# Patient Record
Sex: Male | Born: 1965 | Race: White | Hispanic: No | Marital: Married | Smoking: Never smoker
Health system: Southern US, Community
[De-identification: ages and names within clinical notes are randomized; demographics above are authoritative.]

## PROBLEM LIST (undated history)

## (undated) DIAGNOSIS — Z862 Personal history of diseases of the blood and blood-forming organs and certain disorders involving the immune mechanism: Secondary | ICD-10-CM

## (undated) DIAGNOSIS — K219 Gastro-esophageal reflux disease without esophagitis: Secondary | ICD-10-CM

## (undated) DIAGNOSIS — R1012 Left upper quadrant pain: Secondary | ICD-10-CM

## (undated) DIAGNOSIS — E7800 Pure hypercholesterolemia, unspecified: Secondary | ICD-10-CM

## (undated) DIAGNOSIS — F419 Anxiety disorder, unspecified: Secondary | ICD-10-CM

## (undated) DIAGNOSIS — R7303 Prediabetes: Secondary | ICD-10-CM

## (undated) DIAGNOSIS — D179 Benign lipomatous neoplasm, unspecified: Secondary | ICD-10-CM

## (undated) HISTORY — DX: Benign lipomatous neoplasm, unspecified: D17.9

## (undated) HISTORY — PX: KNEE SURGERY: SHX244

## (undated) HISTORY — PX: COLONOSCOPY: SHX174

## (undated) HISTORY — PX: TONSILLECTOMY: SUR1361

## (undated) HISTORY — PX: SHOULDER SURGERY: SHX246

## (undated) HISTORY — PX: NASAL ENDOSCOPY: SHX286

---

## 2004-01-15 ENCOUNTER — Other Ambulatory Visit: Payer: Self-pay

## 2006-02-04 ENCOUNTER — Ambulatory Visit: Payer: Self-pay | Admitting: Vascular Surgery

## 2006-07-13 ENCOUNTER — Ambulatory Visit: Payer: Self-pay | Admitting: Internal Medicine

## 2008-04-04 ENCOUNTER — Ambulatory Visit: Payer: Self-pay | Admitting: Otolaryngology

## 2008-05-01 ENCOUNTER — Ambulatory Visit: Payer: Self-pay | Admitting: Sports Medicine

## 2008-05-03 ENCOUNTER — Ambulatory Visit: Payer: Self-pay | Admitting: Unknown Physician Specialty

## 2009-02-20 ENCOUNTER — Ambulatory Visit: Payer: Self-pay | Admitting: Unknown Physician Specialty

## 2009-04-19 ENCOUNTER — Ambulatory Visit: Payer: Self-pay | Admitting: Ophthalmology

## 2010-09-06 ENCOUNTER — Ambulatory Visit: Payer: Self-pay

## 2010-09-11 ENCOUNTER — Ambulatory Visit: Payer: Self-pay | Admitting: Sports Medicine

## 2013-02-04 ENCOUNTER — Ambulatory Visit: Payer: Self-pay | Admitting: Physician Assistant

## 2013-04-27 DIAGNOSIS — Z85828 Personal history of other malignant neoplasm of skin: Secondary | ICD-10-CM | POA: Insufficient documentation

## 2013-08-29 ENCOUNTER — Ambulatory Visit: Payer: Self-pay | Admitting: Physician Assistant

## 2014-06-16 DIAGNOSIS — E7800 Pure hypercholesterolemia, unspecified: Secondary | ICD-10-CM | POA: Insufficient documentation

## 2015-08-02 IMAGING — CT CT ANGIO CHEST
1 of 2 series · 18 of 30 positions shown · IV contrast (APPLIED)
Comparison: None.

CLINICAL DATA: SOB and left anterior CP this am, no surg, hx
squamous cell March 2013 lower lip,

EXAM:
CT ANGIOGRAPHY CHEST WITH CONTRAST
TECHNIQUE: Multidetector CT imaging of the chest was performed using the
standard protocol during bolus administration of intravenous
contrast. Multiplanar CT image reconstructions and MIPs were
obtained to evaluate the vascular anatomy.
CONTRAST:  100 mL Isovue 370 IV

[Series 5: pe 1.0 thins · axial · 0.68mm/px · z∈[-1236,-935]mm · 18 of 339 slices shown]
[im 19/339  lung]
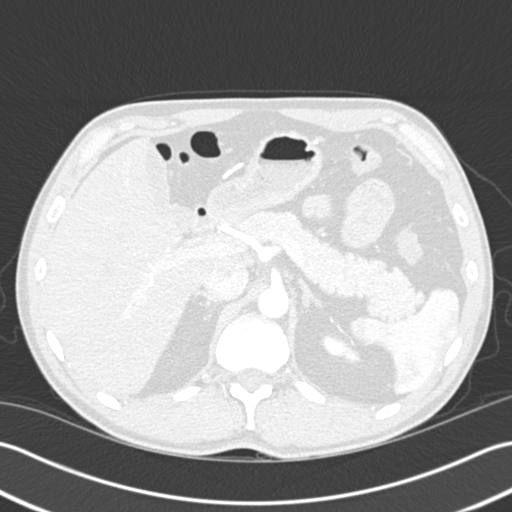
[im 38/339  mediastinal]
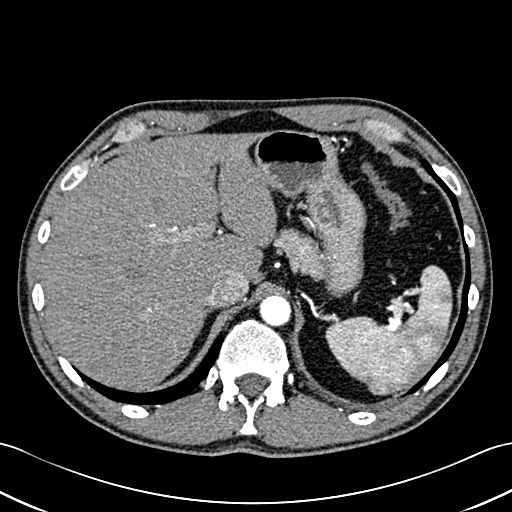
[im 57/339  lung]
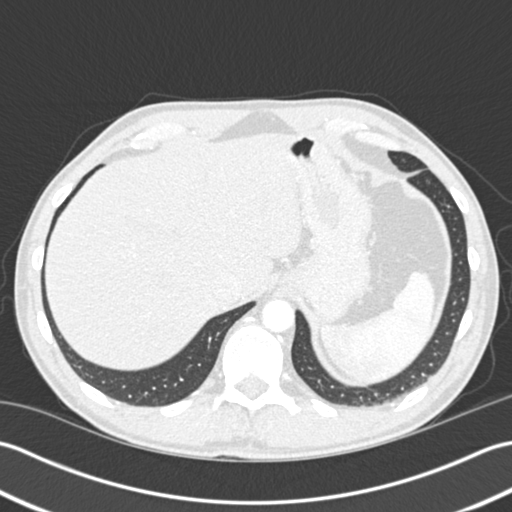
[im 76/339  mediastinal]
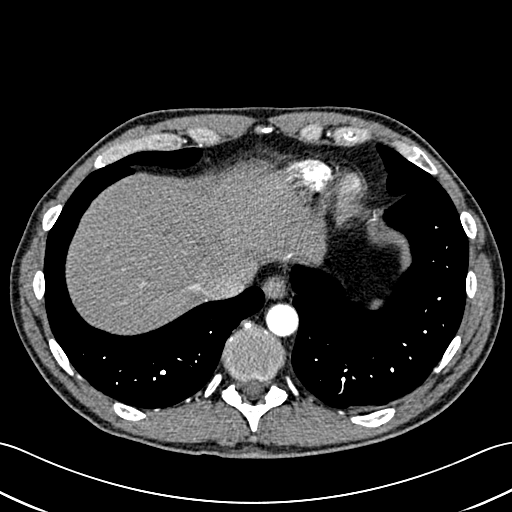
[im 94/339  lung]
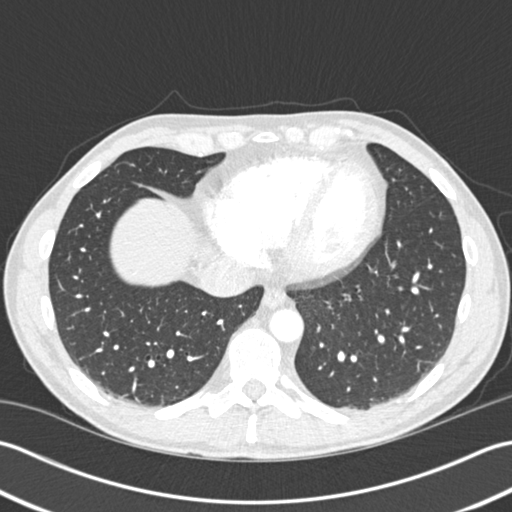
[im 113/339  mediastinal]
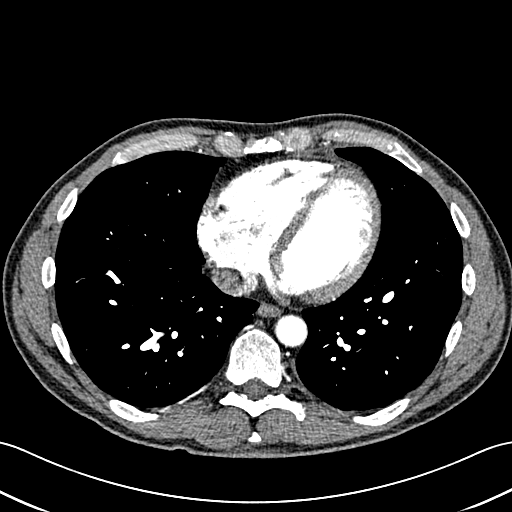
[im 132/339  lung]
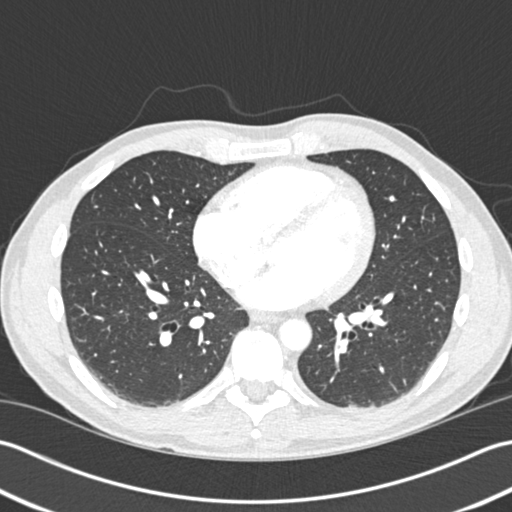
[im 151/339  mediastinal]
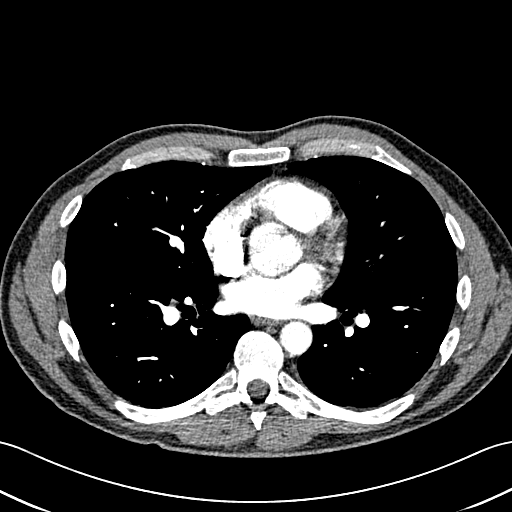
[im 160/339  lung]
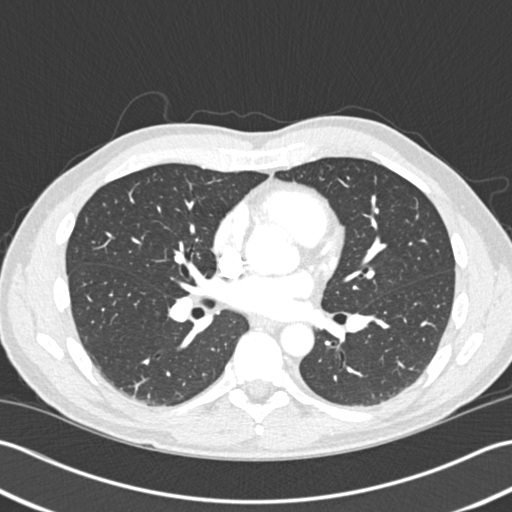
[im 170/339  mediastinal]
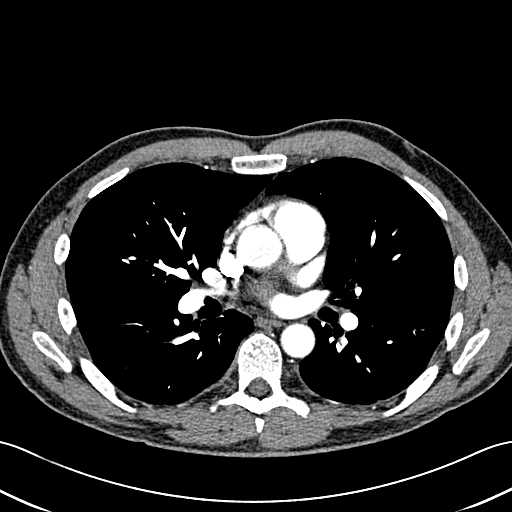
[im 188/339  lung]
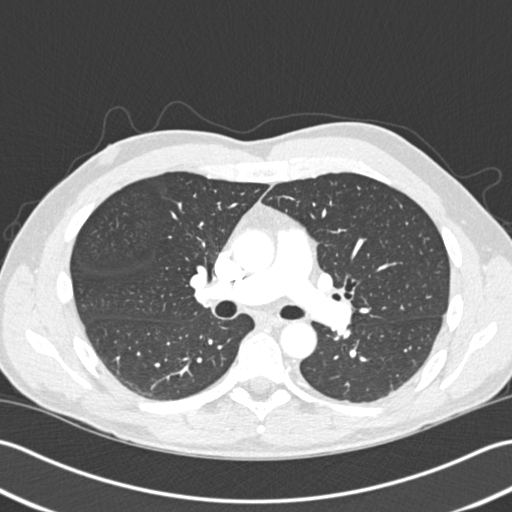
[im 207/339  mediastinal]
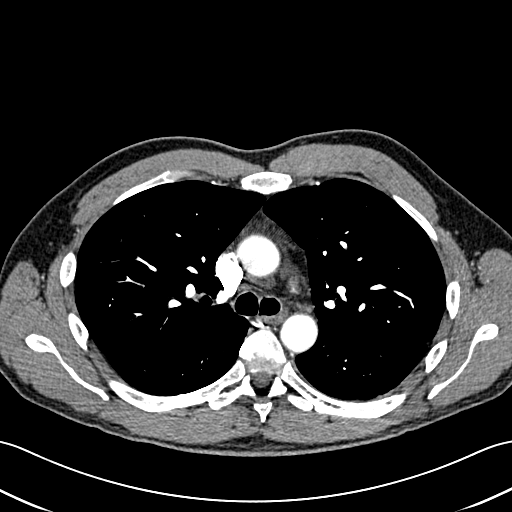
[im 226/339  lung]
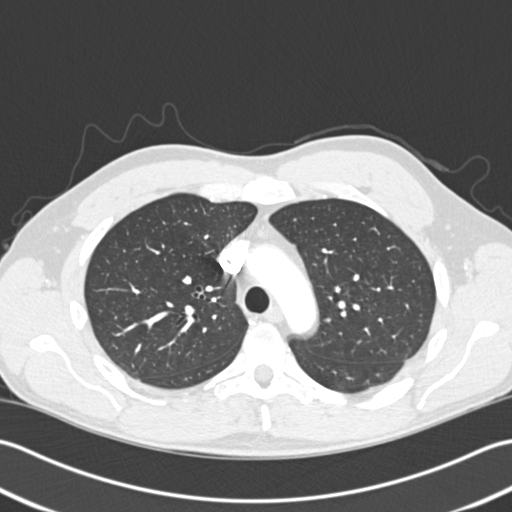
[im 245/339  mediastinal]
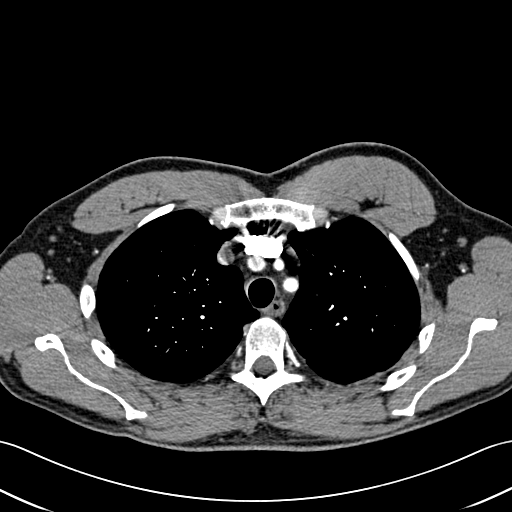
[im 263/339  lung]
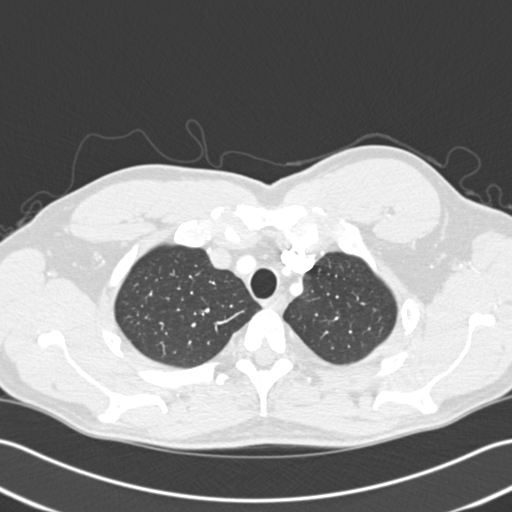
[im 282/339  mediastinal]
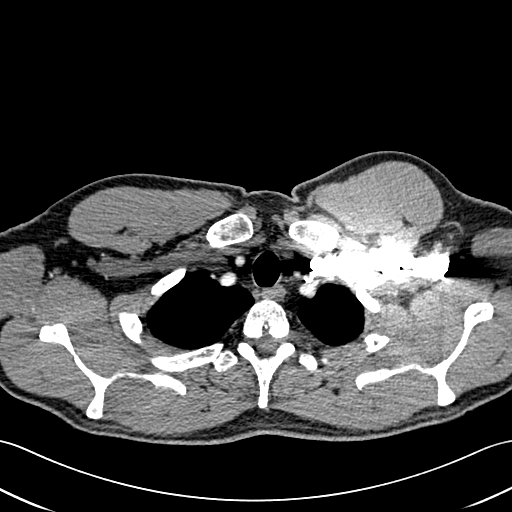
[im 301/339  lung]
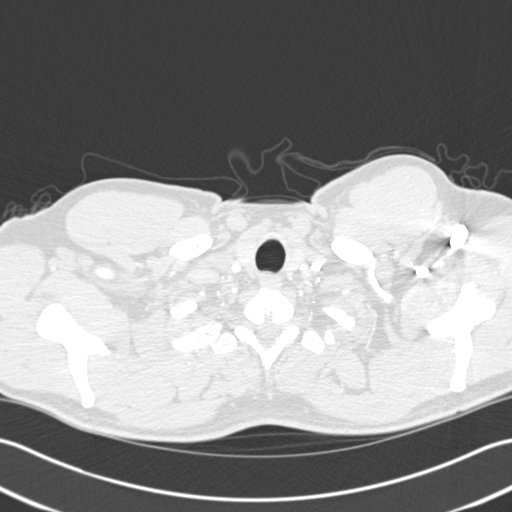
[im 320/339  mediastinal]
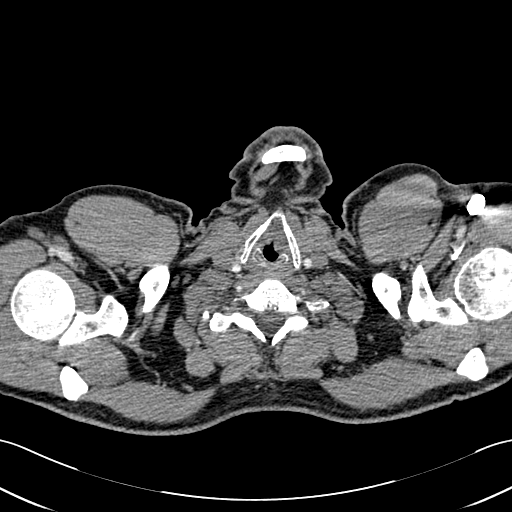

[18 of 30 positions shown; findings below may reference images not displayed]

FINDINGS: Satisfactory opacification of pulmonary arteries noted, and there is
no evidence of pulmonary emboli. Adequate contrast opacification of
the thoracic aorta with no evidence of dissection, aneurysm, or
stenosis. There is classic 3 vessel brachiocephalic arch anatomy
without proximal stenosis. No pleural or pericardial effusion. No
hilar or mediastinal adenopathy. Subpleural scarring or subsegmental
atelectasis posteriorly in both lower lobes. Lungs otherwise clear.
Minimal spurring in the lower thoracic spine. Visualized portions of
upper abdomen unremarkable.

Review of the MIP images confirms the above findings.
IMPRESSION: 1. Negative for acute PE or thoracic aortic dissection.

## 2016-07-14 DIAGNOSIS — G4700 Insomnia, unspecified: Secondary | ICD-10-CM | POA: Insufficient documentation

## 2016-07-14 DIAGNOSIS — Z0000 Encounter for general adult medical examination without abnormal findings: Secondary | ICD-10-CM | POA: Insufficient documentation

## 2018-04-26 DIAGNOSIS — M7700 Medial epicondylitis, unspecified elbow: Secondary | ICD-10-CM | POA: Insufficient documentation

## 2018-12-23 DIAGNOSIS — S46819A Strain of other muscles, fascia and tendons at shoulder and upper arm level, unspecified arm, initial encounter: Secondary | ICD-10-CM | POA: Insufficient documentation

## 2019-01-05 DIAGNOSIS — M7540 Impingement syndrome of unspecified shoulder: Secondary | ICD-10-CM | POA: Insufficient documentation

## 2019-01-18 ENCOUNTER — Ambulatory Visit (INDEPENDENT_AMBULATORY_CARE_PROVIDER_SITE_OTHER): Payer: Managed Care, Other (non HMO) | Admitting: Surgery

## 2019-01-18 ENCOUNTER — Encounter: Payer: Self-pay | Admitting: Surgery

## 2019-01-18 ENCOUNTER — Other Ambulatory Visit: Payer: Self-pay

## 2019-01-18 VITALS — BP 122/87 | HR 111 | Temp 97.9°F | Resp 14 | Ht 70.0 in | Wt 180.0 lb

## 2019-01-18 DIAGNOSIS — D179 Benign lipomatous neoplasm, unspecified: Secondary | ICD-10-CM | POA: Diagnosis not present

## 2019-01-18 NOTE — Progress Notes (Signed)
01/18/2019  Reason for Visit:  Multiple lipomas  History of Present Illness: Alexander Turner is a 53 y.o. male presenting for evaluation of multiple lipomas located in bilateral upper extremities, bilateral lower extremities, and anterior abdominal wall.  Patient has a history of multiple prior lipomas that have been resected through the years, in both arms and back.  He has new ones that are slowly growing in size, and some of which are more somewhat bothersome.  Denies any significant pain from any of them.  He wants to have them excised and wanted to check how to proceed with that since he has so many.    Patient denies any fevers, chills, issues with drainage or infection of any of the sites of prior excision or of current lipoma location, pain, or other concerns.  Past Medical History: --Depression/Anxiety --GERD --Iron deficiency anemia --Allergic rhinitis --Squamous cell cancer of lower lip --Hyperlipidemia  Past Surgical History: Past Surgical History:  Procedure Laterality Date  . COLONOSCOPY    . KNEE SURGERY Right   . NASAL ENDOSCOPY    . TONSILLECTOMY      Home Medications: Prior to Admission medications   Medication Sig Start Date End Date Taking? Authorizing Provider  buPROPion (WELLBUTRIN XL) 300 MG 24 hr tablet Take 300 mg by mouth daily.   Yes [provider]  mometasone (NASONEX) 50 MCG/ACT nasal spray Place 2 sprays into the nose daily.   Yes [provider]  pantoprazole (PROTONIX) 40 MG tablet pantoprazole 40 mg tablet,delayed release 07/16/18  Yes [provider]  zolpidem (AMBIEN) 10 MG tablet zolpidem 10 mg tablet 08/13/18  Yes [provider]    Allergies: Allergies  Allergen Reactions  . Doxycycline   . Trazodone And Nefazodone     Social History:  reports that he has never smoked. He has never used smokeless tobacco. He reports current alcohol use. He reports that he does not use drugs.   Family  History: History reviewed. No pertinent family history.  Review of Systems: Review of Systems  Constitutional: Negative for chills and fever.  HENT: Negative for hearing loss.   Eyes: Negative for blurred vision.  Respiratory: Negative for shortness of breath.   Cardiovascular: Negative for chest pain.  Gastrointestinal: Negative for abdominal pain, nausea and vomiting.  Genitourinary: Negative for dysuria.  Musculoskeletal: Negative for myalgias.  Skin: Negative for rash.  Neurological: Negative for dizziness.  Psychiatric/Behavioral: Negative for depression.    Physical Exam BP 122/87   Pulse (!) 111   Temp 97.9 F (36.6 C) (Temporal)   Resp 14   Ht 5\' 10"  (1.778 m)   Wt 180 lb (81.6 kg)   SpO2 99%   BMI 25.83 kg/m  CONSTITUTIONAL: No acute distress HEENT:  Normocephalic, atraumatic, extraocular motion intact. NECK: Trachea is midline, and there is no jugular venous distension.  RESPIRATORY:  Lungs are clear, and breath sounds are equal bilaterally. Normal respiratory effort without pathologic use of accessory muscles. CARDIOVASCULAR: Heart is regular without murmurs, gallops, or rubs. GI: The abdomen is soft, non-distended, non-tender.  MUSCULOSKELETAL:  Normal muscle strength and tone in all four extremities.  No peripheral edema or cyanosis. SKIN: Patient has numerous, at least 10 different small lipomas, ranging from 1 cm to 2 cm in size, located in bilateral upper extremities, bilateral lower extremities, and anterior abdominal wall.  No lipomas on his back, but he has evidence of prior excisions on his back.  He also has scars from prior excisions  on both upper extremities. NEUROLOGIC:  Motor and sensation is grossly normal.  Cranial nerves are grossly intact. PSYCH:  Alert and oriented to person, place and time. Affect is normal.  Laboratory Analysis: No results found for this or any previous visit (from the past 24 hour(s)).  Imaging: No results  found.  Assessment and Plan: This is a 53 y.o. male with multiple bilateral lipomas.  Discussed with the patient that since he has so many lipomas overall, it may be more worthwhile to do these all under one general anesthesia event in the operating room instead of multiple procedure appointments in the office.  Discussed with him that he should make a list of the lipomas the he absolutely wants to have removed, and the ones that he would like to have removed if possible.  That way on day of surgery, we will start with the priority ones, and then move on to the next tier.  Cannot guarantee that we can remove all of them as this would be a very long procedure, which is why he should tell us which are his tier 1 and tier 2.  Discussed with him that this would be an outpatient procedure, that we would give him prescriptions for pain medications, but that he should take it easy over the first week in order to allow all the incisions to heal well.  Discussed the risks of bleeding, infection, and injury to surrounding structures.  He's willing to proceed.  We will schedule him for 02/10/19.  He understands he will need to get COVID-19 tested prior to surgery.  Face-to-face time spent with the patient and care providers was 45 minutes, with more than 50% of the time spent counseling, educating, and coordinating care of the patient.     Melvyn Neth, [facility]

## 2019-01-18 NOTE — Patient Instructions (Addendum)
  Our surgery scheduler will call you within 24-48 hours to schedule your surgery. Please have your Blue sheet available when she calls.   Lipoma  A lipoma is a noncancerous (benign) tumor that is made up of fat cells. This is a very common type of soft-tissue growth. Lipomas are usually found under the skin (subcutaneous). They may occur in any tissue of the body that contains fat. Common areas for lipomas to appear include the back, shoulders, buttocks, and thighs.  Lipomas grow slowly, and they are usually painless. Most lipomas do not cause problems and do not require treatment. What are the causes? The cause of this condition is not known. What increases the risk? You are more likely to develop this condition if:  You are 62-35 years old.  You have a family history of lipomas. What are the signs or symptoms? A lipoma usually appears as a small, round bump under the skin. In most cases, the lump will:  Feel soft or rubbery.  Not cause pain or other symptoms. However, if a lipoma is located in an area where it pushes on nerves, it can become painful or cause other symptoms. How is this diagnosed? A lipoma can usually be diagnosed with a physical exam. You may also have tests to confirm the diagnosis and to rule out other conditions. Tests may include:  Imaging tests, such as a CT scan or MRI.  Removal of a tissue sample to be looked at under a microscope (biopsy). How is this treated? Treatment for this condition depends on the size of the lipoma and whether it is causing any symptoms.  For small lipomas that are not causing problems, no treatment is needed.  If a lipoma is bigger or it causes problems, surgery may be done to remove the lipoma. Lipomas can also be removed to improve appearance. Most often, the procedure is done after applying a medicine that numbs the area (local anesthetic). Follow these instructions at home:  Watch your lipoma for any changes.  Keep all  follow-up visits as told by your health care provider. This is important. Contact a health care provider if:  Your lipoma becomes larger or hard.  Your lipoma becomes painful, red, or increasingly swollen. These could be signs of infection or a more serious condition. Get help right away if:  You develop tingling or numbness in an area near the lipoma. This could indicate that the lipoma is causing nerve damage. Summary  A lipoma is a noncancerous tumor that is made up of fat cells.  Most lipomas do not cause problems and do not require treatment.  If a lipoma is bigger or it causes problems, surgery may be done to remove the lipoma. This information is not intended to replace advice given to you by your health care provider. Make sure you discuss any questions you have with your health care provider. Document Released: 04/04/2002 Document Revised: 03/31/2017 Document Reviewed: 03/31/2017 Elsevier Patient Education  [employer].

## 2019-01-19 ENCOUNTER — Telehealth: Payer: Self-pay | Admitting: Surgery

## 2019-01-19 NOTE — Telephone Encounter (Signed)
Patient has called and decided that he would like to have his surgical procedure done in the office. Multiple lipoma excisions. I have spoken with Dr Hampton Abbot and he agrees to removing 3-4 at a time in the office with multiple visits. Patient is scheduled to have this done in the office on 10/13.

## 2019-02-08 ENCOUNTER — Other Ambulatory Visit: Payer: Self-pay | Admitting: Surgery

## 2019-02-08 ENCOUNTER — Encounter: Payer: Self-pay | Admitting: Surgery

## 2019-02-08 ENCOUNTER — Ambulatory Visit (INDEPENDENT_AMBULATORY_CARE_PROVIDER_SITE_OTHER): Payer: Managed Care, Other (non HMO) | Admitting: Surgery

## 2019-02-08 ENCOUNTER — Other Ambulatory Visit: Payer: Self-pay

## 2019-02-08 VITALS — BP 126/80 | HR 76 | Temp 97.3°F | Resp 14 | Ht 70.0 in | Wt 179.2 lb

## 2019-02-08 DIAGNOSIS — D179 Benign lipomatous neoplasm, unspecified: Secondary | ICD-10-CM | POA: Diagnosis not present

## 2019-02-08 HISTORY — PX: LIPOMA EXCISION: SHX5283

## 2019-02-08 NOTE — Progress Notes (Signed)
  Procedure Date:  02/08/2019  Pre-operative Diagnosis:  Multiple lipomas of right upper extremity  Post-operative Diagnosis:  Multiple lipomas of right upper extremity  Procedure:  Excision of three lipomas of right upper extremity.  Surgeon:  Melvyn Neth, MD  Assistant:  Deno Etienne, PA-S  Anesthesia:  10 ml 1% lidocaine with epi  Estimated Blood Loss:  5 ml  Specimens:  Right arm, right proximal forearm, right distal forearm lipomas  Complications:  None  Indications for Procedure:  This is a 53 y.o. male with diagnosis of multiple lipomas in the right upper extremity.  The patient wishes to have them excised. The risks of bleeding, abscess or infection, injury to surrounding structures, and need for further procedures were all discussed with the patient and he was willing to proceed.  Description of Procedure: The patient was correctly identified at bedside.  The patient was placed supine.  Appropriate time-outs were performed.  The patient's right upper extremity was prepped and draped in usual sterile fashion.  The location of three lipomas was marked in the distal arm, proximal forearm, and distal forearm.  Local anesthetic was infused intradermally over each lipoma.  For each lipoma, a 2-3 cm incision was made over the lipoma, and scalpel was used to dissect down the subcutaneous tissue to the lipoma itself.  Skin flaps were created, and then the lipoma was excised sharply, intact.  Each one was sent off to pathology separately.  The cavities were then irrigated and hemostasis was good.  The wounds were then closed in two layers using 3-0 Vicryl and 4-0 Monocryl.  The incisions were cleaned and sealed with DermaBond.   The patient tolerated the procedure well and all sharps were appropriately disposed of at the end of the case.   --Activity restrictions with right arm were given. --Follow up in two weeks --Will proceed with excision of lipomas of left upper  extremity in two weeks.   Melvyn Neth, MD

## 2019-02-08 NOTE — Patient Instructions (Addendum)
Today we have removed a Lipoma in our office. Please see information below regarding this type of tumor.  You are free to shower in 48 hours. This will be on 02/10/19.    You have glue on your skin and sutures under the skin. The glue will come off on it's own in 10-14 days. You may shower normally until this occurs but do not submerge.  Please use Tylenol or Ibuprofen for pain as needed, may use ice to the area for comfort also.   We will see you back in 2-3 weeks to ensure that this has healed, to review the final pathology, and to remove the lipomas from your left arm. Please see your appointment below. You may continue your regular activities right away but if you are having pain while doing something, stop what you are doing and try this activity once again in 3 days. Please call our office with any questions or concerns prior to your appointment.   Lipoma Removal Lipoma removal is a surgical procedure to remove a noncancerous (benign) tumor that is made up of fat cells (lipoma). Most lipomas are small and painless and do not require treatment. They can form in many areas of the body but are most common under the skin of the back, shoulders, arms, and thighs. You may need lipoma removal if you have a lipoma that is large, growing, or causing discomfort. Lipoma removal may also be done for cosmetic reasons. Tell a health care provider about:  Any allergies you have.  All medicines you are taking, including vitamins, herbs, eye drops, creams, and over-the-counter medicines.  Any problems you or family members have had with anesthetic medicines.  Any blood disorders you have.  Any surgeries you have had.  Any medical conditions you have.  Whether you are pregnant or may be pregnant. What are the risks? Generally, this is a safe procedure. However, problems may occur, including:  Infection.  Bleeding.  Allergic reactions to medicines.  Damage to nerves or blood vessels near the  lipoma.  Scarring.  What happens before the procedure? Staying hydrated Follow instructions from your health care provider about hydration, which may include:  Up to 2 hours before the procedure - you may continue to drink clear liquids, such as water, clear fruit juice, black coffee, and plain tea.  Eating and drinking restrictions Follow instructions from your health care provider about eating and drinking, which may include:  8 hours before the procedure - stop eating heavy meals or foods such as meat, fried foods, or fatty foods.  6 hours before the procedure - stop eating light meals or foods, such as toast or cereal.  6 hours before the procedure - stop drinking milk or drinks that contain milk.  2 hours before the procedure - stop drinking clear liquids.  Medicines  Ask your health care provider about: ? Changing or stopping your regular medicines. This is especially important if you are taking diabetes medicines or blood thinners. ? Taking medicines such as aspirin and ibuprofen. These medicines can thin your blood. Do not take these medicines before your procedure if your health care provider instructs you not to.  You may be given antibiotic medicine to help prevent infection. General instructions  Ask your health care provider how your surgical site will be marked or identified.  You will have a physical exam. Your health care provider will check the size of the lipoma and whether it can be moved easily.  You may have  imaging tests, such as: ? X-rays. ? CT scan. ? MRI.  Plan to have someone take you home from the hospital or clinic. What happens during the procedure?  To reduce your risk of infection: ? Your health care team will wash or sanitize their hands. ? Your skin will be washed with soap.  You will be given one or more of the following: ? A medicine to help you relax (sedative). ? A medicine to numb the area (local anesthetic). ? A medicine to make  you fall asleep (general anesthetic). ? A medicine that is injected into an area of your body to numb everything below the injection site (regional anesthetic).  An incision will be made over the lipoma or very near the lipoma. The incision may be made in a natural skin line or crease.  Tissues, nerves, and blood vessels near the lipoma will be moved out of the way.  The lipoma and the capsule that surrounds it will be separated from the surrounding tissues.  The lipoma will be removed.  The incision may be closed with stitches (sutures).  A bandage (dressing) will be placed over the incision. What happens after the procedure?  Do not drive for 24 hours if you received a sedative.  Your blood pressure, heart rate, breathing rate, and blood oxygen level will be monitored until the medicines you were given have worn off. This information is not intended to replace advice given to you by your health care provider. Make sure you discuss any questions you have with your health care provider. Document Released: 06/28/2015 Document Revised: 09/20/2015 Document Reviewed: 06/28/2015 Elsevier Interactive Patient Education  [employer].

## 2019-02-10 ENCOUNTER — Ambulatory Visit: Admit: 2019-02-10 | Payer: Managed Care, Other (non HMO) | Admitting: Surgery

## 2019-02-10 SURGERY — EXCISION LIPOMA
Anesthesia: General

## 2019-02-22 ENCOUNTER — Telehealth: Payer: Self-pay | Admitting: Surgery

## 2019-02-22 NOTE — Telephone Encounter (Signed)
Message left for patient letting him know that that was fine and I would make a note in the appointment that we would just be removing on lipoma. He may call back with any questions.

## 2019-02-22 NOTE — Telephone Encounter (Signed)
Patient called about his appointment for Friday, said he only wants to have the left elbow done he doesn't want to remove the others at this time. Please call if any questions.

## 2019-02-25 ENCOUNTER — Ambulatory Visit: Payer: Managed Care, Other (non HMO) | Admitting: Surgery

## 2019-03-04 ENCOUNTER — Ambulatory Visit: Payer: Self-pay | Admitting: Surgery

## 2019-03-18 ENCOUNTER — Ambulatory Visit (INDEPENDENT_AMBULATORY_CARE_PROVIDER_SITE_OTHER): Payer: Managed Care, Other (non HMO) | Admitting: Surgery

## 2019-03-18 ENCOUNTER — Encounter: Payer: Self-pay | Admitting: Surgery

## 2019-03-18 ENCOUNTER — Other Ambulatory Visit: Payer: Self-pay | Admitting: Surgery

## 2019-03-18 ENCOUNTER — Other Ambulatory Visit: Payer: Self-pay

## 2019-03-18 VITALS — BP 119/79 | HR 108 | Temp 97.7°F | Resp 14 | Ht 70.0 in | Wt 178.0 lb

## 2019-03-18 DIAGNOSIS — D1722 Benign lipomatous neoplasm of skin and subcutaneous tissue of left arm: Secondary | ICD-10-CM

## 2019-03-18 HISTORY — PX: LIPOMA EXCISION: SHX5283

## 2019-03-18 NOTE — Patient Instructions (Signed)
Please call our office if you have questions or concerns.   

## 2019-03-18 NOTE — Progress Notes (Signed)
03/18/2019  HPI: Alexander Turner is a 53 y.o. male s/p excision of 3 lipomas of the right upper extremity.  He presents today for wound check as well as excision of a single lipoma of the left upper extremity.  Reports doing well and without any issues with the 3 incisions on his right arm.  Vital signs: BP 119/79   Pulse (!) 108   Temp 97.7 F (36.5 C) (Temporal)   Resp 14   Ht 5\' 10"  (1.778 m)   Wt 178 lb (80.7 kg)   SpO2 97%   BMI 25.54 kg/m    Physical Exam: Constitutional: No acute distress Skin:  3 incisions in right upper extremity well healed, without any evidence of infection or wound breakdown.  No erythema, induration, or drainage.  Patient has one additional lipoma in his left upper extremity, posteriorly, just proximal to his elbow.  It is mobile and non-tender, and will be excised today.  Assessment/Plan: This is a 53 y.o. male s/p excision of 3 right arm lipomas, now presenting for excision of 1 left arm lipoma.   Procedure Date:  03/18/2019  Pre-operative Diagnosis:  Left arm lipoma  Post-operative Diagnosis:  Left arm lipoma  Procedure:  Excision of left arm lipoma  Surgeon:  Melvyn Neth, MD  Assistant:  Merlinda Frederick, PA-S  Anesthesia:  3 ml 1% lidocaine with epi  Estimated Blood Loss:  3 ml  Specimens:  Left arm lipoma  Complications:  None  Indications for Procedure:  This is a 53 y.o. male with diagnosis of a symptomatic left arm lipoma.  The patient wishes to have this excised. The risks of bleeding, abscess or infection, injury to surrounding structures, and need for further procedures were all discussed with the patient and he was willing to proceed.  Description of Procedure: The patient was correctly identified at bedside.  He was placed in prone position in the procedure bed.  Appropriate timeouts were performed.  The patient's left elbow area was prepped and draped in usual sterile fashion.  Local anesthetic was infused  intradermally. A longitudinal 2.5 cm incision was made over the lipoma, and scalpel was used to dissect down to the lipoma itself.  Skin flaps were created sharply as well, and then the lipoma was excised, intact.  It was sent off to pathology.  The cavity was then irrigated and hemostasis was assured with two 3-0 Vicryl sutures. The wound was then closed in two layers using 3-0 Vicryl and 4-0 Monocryl.  The incision was cleaned and sealed with DermaBond.   The patient tolerated the procedure well and all sharps were appropriately disposed of at the end of the case.  --Patient will follow up in two weeks for wound check. --Avoid strenuous activity with the left arm for two weeks.    Melvyn Neth, [facility]

## 2019-04-01 ENCOUNTER — Encounter: Payer: Self-pay | Admitting: Surgery

## 2019-07-25 ENCOUNTER — Ambulatory Visit: Payer: Self-pay | Attending: Internal Medicine

## 2019-07-25 DIAGNOSIS — Z23 Encounter for immunization: Secondary | ICD-10-CM

## 2019-07-25 NOTE — Progress Notes (Signed)
   Covid-19 Vaccination Clinic  Name:  Alexander Turner    MRN: [id number] DOB: [birth year]  07/25/2019  Mr. [name] was observed post Covid-19 immunization for 15 minutes without incident. He was provided with Vaccine Information Sheet and instruction to access the V-Safe system.   Mr. [name] was instructed to call 911 with any severe reactions post vaccine: Marland Kitchen Difficulty breathing  . Swelling of face and throat  . A fast heartbeat  . A bad rash all over body  . Dizziness and weakness   Immunizations Administered    Name Date Dose VIS Date Route   Pfizer COVID-19 Vaccine 07/25/2019  3:42 PM 0.3 mL 04/08/2019 Intramuscular   Manufacturer: [place]   Lot: [id number]   [place]: [id number]

## 2019-08-19 ENCOUNTER — Ambulatory Visit: Payer: Self-pay | Attending: Internal Medicine

## 2019-08-19 ENCOUNTER — Other Ambulatory Visit: Payer: Self-pay

## 2019-08-19 DIAGNOSIS — Z23 Encounter for immunization: Secondary | ICD-10-CM

## 2019-08-19 NOTE — Progress Notes (Signed)
   Covid-19 Vaccination Clinic  Name:  Alexander Turner    MRN: [id number] DOB: 07-06-1965  08/19/2019  [name] was observed post Covid-19 immunization for 15 minutes without incident. He was provided with Vaccine Information Sheet and instruction to access the V-Safe system.   [name] was instructed to call 911 with any severe reactions post vaccine: Marland Kitchen Difficulty breathing  . Swelling of face and throat  . A fast heartbeat  . A bad rash all over body  . Dizziness and weakness   Immunizations Administered    Name Date Dose VIS Date Route   Pfizer COVID-19 Vaccine 08/19/2019  3:13 PM 0.3 mL 06/22/2018 Intramuscular   Manufacturer: [employer], [employer]   Lot: [id number]   [place]: [id number]

## 2020-02-20 ENCOUNTER — Ambulatory Visit (INDEPENDENT_AMBULATORY_CARE_PROVIDER_SITE_OTHER): Payer: 59 | Admitting: Surgery

## 2020-02-20 ENCOUNTER — Other Ambulatory Visit: Payer: Self-pay

## 2020-02-20 ENCOUNTER — Encounter: Payer: Self-pay | Admitting: Surgery

## 2020-02-20 VITALS — BP 126/82 | HR 88 | Temp 98.9°F | Ht 70.0 in | Wt 174.4 lb

## 2020-02-20 DIAGNOSIS — D179 Benign lipomatous neoplasm, unspecified: Secondary | ICD-10-CM | POA: Diagnosis not present

## 2020-02-20 NOTE — Patient Instructions (Addendum)
Patient will follow up on November 15 th, 2021 at 2:30pm for 2-hour in office Lipoma Removal procedure.   Lipoma  A lipoma is a noncancerous (benign) tumor that is made up of fat cells. This is a very common type of soft-tissue growth. Lipomas are usually found under the skin (subcutaneous). They may occur in any tissue of the body that contains fat. Common areas for lipomas to appear include the back, arms, shoulders, buttocks, and thighs. Lipomas grow slowly, and they are usually painless. Most lipomas do not cause problems and do not require treatment. What are the causes? The cause of this condition is not known. What increases the risk? You are more likely to develop this condition if:  You are 54 years old.  You have a family history of lipomas. What are the signs or symptoms? A lipoma usually appears as a small, round bump under the skin. In most cases, the lump will:  Feel soft or rubbery.  Not cause pain or other symptoms. However, if a lipoma is located in an area where it pushes on nerves, it can become painful or cause other symptoms. How is this diagnosed? A lipoma can usually be diagnosed with a physical exam. You may also have tests to confirm the diagnosis and to rule out other conditions. Tests may include:  Imaging tests, such as a CT scan or an MRI.  Removal of a tissue sample to be looked at under a microscope (biopsy). How is this treated? Treatment for this condition depends on the size of the lipoma and whether it is causing any symptoms.  For small lipomas that are not causing problems, no treatment is needed.  If a lipoma is bigger or it causes problems, surgery may be done to remove the lipoma. Lipomas can also be removed to improve appearance. Most often, the procedure is done after applying a medicine that numbs the area (local anesthetic).  Liposuction may be done to reduce the size of the lipoma before it is removed through surgery, or it may be done  to remove the lipoma. Lipomas are removed with this method in order to limit incision size and scarring. A liposuction tube is inserted through a small incision into the lipoma, and the contents of the lipoma are removed through the tube with suction. Follow these instructions at home:  Watch your lipoma for any changes.  Keep all follow-up visits as told by your health care provider. This is important. Contact a health care provider if:  Your lipoma becomes larger or hard.  Your lipoma becomes painful, red, or increasingly swollen. These could be signs of infection or a more serious condition. Get help right away if:  You develop tingling or numbness in an area near the lipoma. This could indicate that the lipoma is causing nerve damage. Summary  A lipoma is a noncancerous tumor that is made up of fat cells.  Most lipomas do not cause problems and do not require treatment.  If a lipoma is bigger or it causes problems, surgery may be done to remove the lipoma.  Contact a health care provider if your lipoma becomes larger or hard, or if it becomes painful, red, or increasingly swollen. Pain, redness, and swelling could be signs of infection or a more serious condition. This information is not intended to replace advice given to you by your health care provider. Make sure you discuss any questions you have with your health care provider. Document Revised: 11/29/2018 Document Reviewed: 11/29/2018 Elsevier  Patient Education  [employer].

## 2020-02-20 NOTE — Progress Notes (Signed)
02/20/2020  History of Present Illness: Alexander Turner is a 54 y.o. male presenting for follow up of multiple lipomas.  The patient was last seen on 03/18/19 after a lipoma of the LUE was excised.  The patient reports he did not follow up afterwards because of changes in employer and insurance.  He presents today because of further lipomas that he would like to discuss excision.  He reports that he has lipomas of bilateral hip areas and anterior thighs, and he's recently developed some lipomas of the anterior chest wall.  They are not particularly tender but sometimes cause some discomfort.  Denies any skin redness or induration or any drainage from any of the areas.  Past Medical History: Past Medical History:  Diagnosis Date  . Multiple lipomas    bilateral upper and lower extremities     Past Surgical History: Past Surgical History:  Procedure Laterality Date  . COLONOSCOPY    . KNEE SURGERY Right   . LIPOMA EXCISION Right 02/08/2019   RUE x 3 lipomas  . LIPOMA EXCISION Left 03/18/2019   LUE x 1 lipoma  . NASAL ENDOSCOPY    . TONSILLECTOMY      Home Medications: Prior to Admission medications   Medication Sig Start Date End Date Taking? Authorizing Provider  buPROPion (WELLBUTRIN XL) 300 MG 24 hr tablet Take 300 mg by mouth daily.   Yes [provider]  celecoxib (CELEBREX) 100 MG capsule Take 100 mg by mouth 2 (two) times daily. 01/17/20  Yes [provider]  fluticasone (FLONASE) 50 MCG/ACT nasal spray Place 2 sprays into both nostrils daily. 01/31/20  Yes [provider]  mometasone (NASONEX) 50 MCG/ACT nasal spray Place 2 sprays into the nose daily.   Yes [provider]  zolpidem (AMBIEN) 10 MG tablet zolpidem 10 mg tablet 08/13/18  Yes [provider]  pantoprazole (PROTONIX) 40 MG tablet pantoprazole 40 mg tablet,delayed release Patient not taking: Reported on 02/20/2020 07/16/18   [provider]     Allergies: Allergies  Allergen Reactions  . Doxycycline   . Trazodone And Nefazodone     Review of Systems: Review of Systems  Constitutional: Negative for chills and fever.  Respiratory: Negative for shortness of breath.   Cardiovascular: Negative for chest pain.  Gastrointestinal: Negative for nausea and vomiting.  Skin: Negative for rash.    Physical Exam BP 126/82   Pulse 88   Temp 98.9 F (37.2 C) (Oral)   Ht 5\' 10"  (1.778 m)   Wt 174 lb 6.4 oz (79.1 kg)   SpO2 98%   BMI 25.02 kg/m  CONSTITUTIONAL: No acute distress HEENT:  Normocephalic, atraumatic, extraocular motion intact. RESPIRATORY: Normal respiratory effort without pathologic use of accessory muscles. CARDIOVASCULAR:  Regular rhythm and rate. SKIN:  Prior excision sites of bilateral upper extremities are well healed.  The patient has 6-7 small lipomas ranging from 1-2 cm along the left anterior thigh to the left lateral hip.  All are mobile, soft, without erythema or induration.  The patient has 4 small lipomas ranging from 1-2 cm along the right anterior thigh to the right lateral hip.  All are mobile, soft, without erythema or induration.  The patient also has 2 small lipomas about 1-2 cm over the anterior chest wall.  All are mobile, soft, without erythema or induration. NEUROLOGIC:  Motor and sensation is grossly normal.  Cranial nerves are grossly intact. PSYCH:  Alert and oriented to person, place and time. Affect is  normal.  Labs/Imaging: None recently  Assessment and Plan: This is a 54 y.o. male with multiple lipomas.  --Discussed with the patient that we can continue our initial plan of stepwise excision of the lipomas as office procedures.  The lipomas are overall small and in locations that are amenable for office excision rather than needing general anesthesia in the OR.  The patient would like to start with the left side first.  We'll schedule him for 11/15 for a 2 hr block so we can excise all  the ones of the left thigh/hip.  Face-to-face time spent with the patient and care providers was 15 minutes, with more than 50% of the time spent counseling, educating, and coordinating care of the patient.     Melvyn Neth, [facility]

## 2020-03-12 ENCOUNTER — Ambulatory Visit (INDEPENDENT_AMBULATORY_CARE_PROVIDER_SITE_OTHER): Payer: 59 | Admitting: Surgery

## 2020-03-12 ENCOUNTER — Encounter: Payer: Self-pay | Admitting: Surgery

## 2020-03-12 ENCOUNTER — Other Ambulatory Visit: Payer: Self-pay | Admitting: Surgery

## 2020-03-12 ENCOUNTER — Other Ambulatory Visit: Payer: Self-pay

## 2020-03-12 VITALS — BP 133/82 | HR 99 | Temp 89.0°F | Ht 70.0 in | Wt 178.0 lb

## 2020-03-12 DIAGNOSIS — D179 Benign lipomatous neoplasm, unspecified: Secondary | ICD-10-CM

## 2020-03-12 HISTORY — PX: LIPOMA EXCISION: SHX5283

## 2020-03-12 NOTE — Progress Notes (Signed)
  Procedure Date:  03/12/2020  Pre-operative Diagnosis:  Multiple lipomas of left thigh and hip areas  Post-operative Diagnosis:  Multiple lipomas of left thigh and hip areas  Procedure:  Excision of 5 lipomas of left thigh and hip areas.  Surgeon:  Melvyn Neth, MD  Anesthesia:  20 ml 1% lidocaine with epi  Estimated Blood Loss:  5 ml  Specimens:   1.  Left lower extremity lipoma #1 2.  Left lower extremity lipoma #2 3.  Left lower extremity lipoma #3 4.  Left lower extremity lipoma #4 5.  Left lower extremity lipoma #5  Complications:  None  Indications for Procedure:  This is a 54 y.o. male with diagnosis of multiple lipomas.  He's s/p excision of lipomas of the left upper and right upper extremities, and now he presents for excision of lipomas of the left thigh and hip areas. The risks of bleeding, abscess or infection, injury to surrounding structures, and need for further procedures were all discussed with the patient and he was willing to proceed.  Description of Procedure: The patient was correctly identified at bedside.  The patient was placed supine.  Appropriate time-outs were performed.  The patient's left thigh and hip areas were prepped and draped in usual sterile fashion.  Overall, he had lipomas ranging from 1.5 to 3 cm in size in the mid distal thigh (#1), medial distal thigh (#2), mid mid-level thigh (#3), lateral proximal thigh (#4), and lateral hip (#5).  Local anesthetic was infused intradermally over each lipoma.  Starting distally, incisions ranging from 1.5 to 3 cm in length were made over each lipoma.  Scalpel was used to dissect down the skin to the subcutaneous layer.  Skin flaps were created sharply with scalpel, and scalpel was used to excise each lipoma, intact.  These were labeled #1 to #5 going distally to proximally, respectively.  Each cavity was then irrigated and hemostasis was assured with manual pressure.  All wounds were then irrigated and  closed in two layers using 3-0 Vicryl and 4-0 Monocryl.  The incisions were cleaned and sealed with DermaBond.  The patient tolerated the procedure well and all were appropriately disposed of at the end of the case.  --Patient will follow up in one week for post-op check. --May shower, but do not scrub wounds heavily --May use Tylenol and Ibuprofen for pain control. --Avoid strenuous activity with the left leg for 1 week.   Melvyn Neth, MD

## 2020-03-12 NOTE — Patient Instructions (Addendum)
Please see your follow up appointment listed below.   Refrain from exercising for one week.  Today we have removed a Lipoma in our office. Please see information below regarding this type of tumor.  You are free to shower tomorrow  Do not apply any creams or ointments or hydrogen peroxide.   You have glue on your skin and sutures under the skin. The glue will come off on it's own in 10-14 days. You may shower normally until this occurs but do not submerge.  Please use Tylenol or Ibuprofen for pain as needed.  We will see you back in 2 weeks to ensure that this has healed and to review the final pathology. Please see your appointment below. You may continue your regular activities right away but if you are having pain while doing something, stop what you are doing and try this activity once again in 3 days. Please call our office with any questions or concerns prior to your appointment.   Lipoma Removal Lipoma removal is a surgical procedure to remove a noncancerous (benign) tumor that is made up of fat cells (lipoma). Most lipomas are small and painless and do not require treatment. They can form in many areas of the body but are most common under the skin of the back, shoulders, arms, and thighs. You may need lipoma removal if you have a lipoma that is large, growing, or causing discomfort. Lipoma removal may also be done for cosmetic reasons. Tell a health care provider about:  Any allergies you have.  All medicines you are taking, including vitamins, herbs, eye drops, creams, and over-the-counter medicines.  Any problems you or family members have had with anesthetic medicines.  Any blood disorders you have.  Any surgeries you have had.  Any medical conditions you have.  Whether you are pregnant or may be pregnant. What are the risks? Generally, this is a safe procedure. However, problems may occur, including:  Infection.  Bleeding.  Allergic reactions to  medicines.  Damage to nerves or blood vessels near the lipoma.  Scarring.  What happens before the procedure? Staying hydrated Follow instructions from your health care provider about hydration, which may include:  Up to 2 hours before the procedure - you may continue to drink clear liquids, such as water, clear fruit juice, black coffee, and plain tea.  Eating and drinking restrictions Follow instructions from your health care provider about eating and drinking, which may include:  8 hours before the procedure - stop eating heavy meals or foods such as meat, fried foods, or fatty foods.  6 hours before the procedure - stop eating light meals or foods, such as toast or cereal.  6 hours before the procedure - stop drinking milk or drinks that contain milk.  2 hours before the procedure - stop drinking clear liquids.  Medicines  Ask your health care provider about: ? Changing or stopping your regular medicines. This is especially important if you are taking diabetes medicines or blood thinners. ? Taking medicines such as aspirin and ibuprofen. These medicines can thin your blood. Do not take these medicines before your procedure if your health care provider instructs you not to.  You may be given antibiotic medicine to help prevent infection. General instructions  Ask your health care provider how your surgical site will be marked or identified.  You will have a physical exam. Your health care provider will check the size of the lipoma and whether it can be moved easily.  You  may have imaging tests, such as: ? X-rays. ? CT scan. ? MRI.  Plan to have someone take you home from the hospital or clinic. What happens during the procedure?  To reduce your risk of infection: ? Your health care team will wash or sanitize their hands. ? Your skin will be washed with soap.  You will be given one or more of the following: ? A medicine to help you relax (sedative). ? A medicine to  numb the area (local anesthetic). ? A medicine to make you fall asleep (general anesthetic). ? A medicine that is injected into an area of your body to numb everything below the injection site (regional anesthetic).  An incision will be made over the lipoma or very near the lipoma. The incision may be made in a natural skin line or crease.  Tissues, nerves, and blood vessels near the lipoma will be moved out of the way.  The lipoma and the capsule that surrounds it will be separated from the surrounding tissues.  The lipoma will be removed.  The incision may be closed with stitches (sutures).  A bandage (dressing) will be placed over the incision. What happens after the procedure?  Do not drive for 24 hours if you received a sedative.  Your blood pressure, heart rate, breathing rate, and blood oxygen level will be monitored until the medicines you were given have worn off. This information is not intended to replace advice given to you by your health care provider. Make sure you discuss any questions you have with your health care provider. Document Released: 06/28/2015 Document Revised: 09/20/2015 Document Reviewed: 06/28/2015 Elsevier Interactive Patient Education  [employer].

## 2020-03-19 ENCOUNTER — Ambulatory Visit (INDEPENDENT_AMBULATORY_CARE_PROVIDER_SITE_OTHER): Payer: 59 | Admitting: Surgery

## 2020-03-19 ENCOUNTER — Other Ambulatory Visit: Payer: Self-pay

## 2020-03-19 ENCOUNTER — Encounter: Payer: Self-pay | Admitting: Surgery

## 2020-03-19 VITALS — BP 154/89 | HR 81 | Temp 98.2°F | Ht 70.0 in | Wt 174.0 lb

## 2020-03-19 DIAGNOSIS — R21 Rash and other nonspecific skin eruption: Secondary | ICD-10-CM

## 2020-03-19 DIAGNOSIS — D179 Benign lipomatous neoplasm, unspecified: Secondary | ICD-10-CM

## 2020-03-19 MED ORDER — TRIAMCINOLONE ACETONIDE 0.1 % EX OINT: 1.0000 "application " | TOPICAL_OINTMENT | Freq: Two times a day (BID) | CUTANEOUS | 0 refills | Status: DC

## 2020-03-19 NOTE — Patient Instructions (Addendum)
Please call our office if you have questions or concerns. Please pick up your medication at the pharmacy.  

## 2020-03-19 NOTE — Progress Notes (Signed)
03/19/2020  HPI: Alexander Turner is a 54 y.o. male s/p excision of 5 lipomas in the left thigh and hip on 03/12/2020.  Pathology resulted a angiolipomas.  He presents today for follow-up.  He reports that 3 days after the procedure, he started developing a rash around the incisions.  They are very itchy and he has tried over-the-counter hydrocortisone which has not helped.  Denies any troubles with the incisions themselves no reports having some soreness from the procedure.  Vital signs: BP (!) 154/89   Pulse 81   Temp 98.2 F (36.8 C) (Oral)   Ht 5\' 10"  (1.778 m)   Wt 174 lb (78.9 kg)   SpO2 98%   BMI 24.97 kg/m    Physical Exam: Constitutional: No acute distress  Skin: Left thigh and hip areas have 5 incisions that are healing well have a surrounding area of erythema ranging between 5 to 6 cm in diameter.  There is no drainage from the incisions and the incisions themselves are clean, dry, and intact.  Assessment/Plan: This is a 54 y.o. male s/p excision of 5 lipomas left thigh and hip ambulance.  -Discussed with him that this could be allergic reaction to either the Dermabond or the ChloraPrep that was used.  Both were used in the previous 2 procedures that he had for the left upper extremity and the right upper extremity however this could be a new allergic reaction due to repeated exposures.  Discussed with him that on her next procedure date for the right thigh lipomas, we would avoid use of both precaution. -Discussed with him that he can start taking Claritin for the allergies as well as use Benadryl ointment directly to the areas and we will send a prescription for triamcinolone 0.1% topical ointment to be used for his rash as well. -Follow-up in 2 weeks for excision of the lipomas of the right thigh.   Melvyn Neth, [facility]

## 2020-03-20 ENCOUNTER — Telehealth: Payer: Self-pay | Admitting: *Deleted

## 2020-03-20 MED ORDER — TRIAMCINOLONE ACETONIDE 0.5 % EX OINT
1.0000 | TOPICAL_OINTMENT | Freq: Two times a day (BID) | CUTANEOUS | 0 refills | Status: DC
Start: 2020-03-20 — End: 2020-04-04

## 2020-03-20 NOTE — Telephone Encounter (Signed)
Left message letting patient know that I have routed his request for medication to [facility].

## 2020-03-20 NOTE — Telephone Encounter (Signed)
Patient was seen yesterday by Dr Hampton Abbot. He was prescribed Kenalog ointment for his rash, he started using it yesterday but last night he was unable to sleep good due to the itching. He stated that it seemed worse last night. Dr Hampton Abbot mention that he could prescribe him some prednisone if the ointment doesn't work. He would like to try that as well.

## 2020-03-20 NOTE — Telephone Encounter (Signed)
Called patient and discussed that I would order new prescription for Triamcinolone 0.5% ointment BID.  Discussed reasoning for not ordering system steroid at this point.  He can also use Benadryl po and ointment to help with itching/rash.  Will discontinue Triamcinolone 0.1% that was started yesterday.  Olean Ree, MD

## 2020-04-02 ENCOUNTER — Ambulatory Visit: Payer: Self-pay | Admitting: Surgery

## 2020-04-04 ENCOUNTER — Other Ambulatory Visit: Payer: Self-pay | Admitting: Surgery

## 2020-04-04 ENCOUNTER — Encounter: Payer: Self-pay | Admitting: Surgery

## 2020-04-04 ENCOUNTER — Ambulatory Visit (INDEPENDENT_AMBULATORY_CARE_PROVIDER_SITE_OTHER): Payer: 59 | Admitting: Surgery

## 2020-04-04 ENCOUNTER — Other Ambulatory Visit: Payer: Self-pay

## 2020-04-04 VITALS — BP 132/76 | HR 85 | Temp 98.6°F | Ht 70.0 in | Wt 173.0 lb

## 2020-04-04 DIAGNOSIS — D179 Benign lipomatous neoplasm, unspecified: Secondary | ICD-10-CM

## 2020-04-04 HISTORY — PX: LIPOMA EXCISION: SHX5283

## 2020-04-04 MED ORDER — TRIAMCINOLONE ACETONIDE 0.5 % EX OINT: 1.0000 "application " | TOPICAL_OINTMENT | Freq: Two times a day (BID) | CUTANEOUS | 0 refills | Status: DC

## 2020-04-04 MED ORDER — SULFAMETHOXAZOLE-TRIMETHOPRIM 800-160 MG PO TABS
1.0000 | ORAL_TABLET | Freq: Two times a day (BID) | ORAL | 0 refills | Status: DC
Start: 2020-04-04 — End: 2020-04-25

## 2020-04-04 NOTE — Progress Notes (Signed)
Procedure Date:  04/04/2020  Pre-operative Diagnosis:   Multiple lipomas of right hip, right lower quadrant abdominal wall, and right chest wall  Post-operative Diagnosis:   Same  Procedure:  Excision of right hip lipoma, right lower quadrant abdominal wall lipoma, and right chest wall lipoma, with layered closure of each.  Surgeon:  Melvyn Neth, MD  Anesthesia:  8 ml of 1% lidocaine with epi  Estimated Blood Loss:  10 ml  Specimens: 1.  Right hip lipoma 2.  Right lower quadrant abdominal wall lipoma 3.  Right chest wall lipoma  Complications:  None  Indications for Procedure:  This is a 54 y.o. male with diagnosis of a symptomatic lipoma of right hip, right abdominal wall, and right chest wall.  The patient wishes to have them excised. The risks of bleeding, abscess or infection, injury to surrounding structures, and need for further procedures were all discussed with the patient and he was willing to proceed.  Of note, patient also had developed a rash on the left thigh/hip area after his last excision procedures, and was given Triamcinolone 0.5% ointment.  He has since developed mild erythema around the left hip incision, without any drainage, but has some tenderness.  Description of Procedure: The patient was correctly identified at bedside.  The patient was placed supine.  Appropriate time-outs were performed.  Given his prior rash after procedure, I decided to use betadine prep instead of Chloroprep.  The patient's right hip was prepped and draped in usual sterile fashion.  Local anesthetic was infused intradermally.  A 3 cm incision was made over the lipoma, and scalpel was used to dissect down the skin and subcutaneous tissue.  Skin flaps were created sharply, and then the lipoma was excised intact.  It was sent off to pathology.  The cavity was then irrigated and hemostasis was assured with 3-0 Vicryl suture at the bed.  The wound was then closed in two layers using 3-0  Vicryl and 4-0 Monocryl.  The incision was cleaned and sealed with DermaBond.  The patient's right lower quadrant abdominal wall was prepped and draped in usual sterile fashion.  Local anesthetic was infused intradermally.  A 3 cm incision was made over the lipoma, and scalpel was used to dissect down the skin and subcutaneous tissue.  Skin flaps were created sharply, and then the lipoma was excised intact.  It was sent off to pathology.  The cavity was then irrigated and hemostasis was good.  The wound was then closed in two layers using 3-0 Vicryl and 4-0 Monocryl.  The incision was cleaned and sealed with DermaBond.  The patient's right chest wall at the inframammary fold was prepped and draped in usual sterile fashion.  Local anesthetic was infused intradermally.  A 3 cm incision was made over the lipoma, and scalpel was used to dissect down the skin and subcutaneous tissue.  Skin flaps were created sharply, and then the lipoma was excised intact.  It was sent off to pathology.  The cavity was then irrigated and hemostasis was assured with 3-0 Vicryl suture at the bed.  The wound was then closed in two layers using 3-0 Vicryl and 4-0 Monocryl.  The incision was cleaned and sealed with DermaBond.   The patient tolerated the procedures well and all sharps were appropriately disposed of at the end of the case.  --Patient may take Tylenol or Ibuprofen for pain control. --Will send Bactrim 7 day course for possible left hip wound infection, and refill  his Triamcinolone ointment in case he gets a new rash. --Follow up on 12/29 for excision of LUE lipomas.   Melvyn Neth, MD

## 2020-04-04 NOTE — Patient Instructions (Addendum)
Today we have removed several lipomas in our office. Please see information below regarding this type of tumor.  You are free to shower tomorrow. Do not scrub the areas. Pat dry gently.   You have glue on your skin and sutures under the skin. The glue will come off on it's own in 10-14 days. You may shower normally until this occurs but do not submerge.  Please use Tylenol or Ibuprofen for pain as needed. You may use ice to the area several times a day for the next two days.   We will see you back in 2 weeks to ensure that this has healed and to review the final pathology. Please see your appointment below. You may continue your regular activities right away but if you are having pain while doing something, stop what you are doing and try this activity once again in 3 days. Please call our office with any questions or concerns prior to your appointment.   Lipoma Removal Lipoma removal is a surgical procedure to remove a noncancerous (benign) tumor that is made up of fat cells (lipoma). Most lipomas are small and painless and do not require treatment. They can form in many areas of the body but are most common under the skin of the back, shoulders, arms, and thighs. You may need lipoma removal if you have a lipoma that is large, growing, or causing discomfort. Lipoma removal may also be done for cosmetic reasons. Tell a health care provider about:  Any allergies you have.  All medicines you are taking, including vitamins, herbs, eye drops, creams, and over-the-counter medicines.  Any problems you or family members have had with anesthetic medicines.  Any blood disorders you have.  Any surgeries you have had.  Any medical conditions you have.  Whether you are pregnant or may be pregnant. What are the risks? Generally, this is a safe procedure. However, problems may occur, including:  Infection.  Bleeding.  Allergic reactions to medicines.  Damage to nerves or blood vessels near  the lipoma.  Scarring.  What happens before the procedure? Staying hydrated Follow instructions from your health care provider about hydration, which may include:  Up to 2 hours before the procedure - you may continue to drink clear liquids, such as water, clear fruit juice, black coffee, and plain tea.  Medicines  Ask your health care provider about: ? Changing or stopping your regular medicines. This is especially important if you are taking diabetes medicines or blood thinners. ? Taking medicines such as aspirin and ibuprofen. These medicines can thin your blood. Do not take these medicines before your procedure if your health care provider instructs you not to.  You may be given antibiotic medicine to help prevent infection. General instructions  Ask your health care provider how your surgical site will be marked or identified.  You will have a physical exam. Your health care provider will check the size of the lipoma and whether it can be moved easily. What happens during the procedure?  To reduce your risk of infection: ? Your health care team will wash or sanitize their hands. ? Your skin will be washed with soap.  You will be given one or more of the following: ? A medicine to help you relax (sedative). ? A medicine to numb the area (local anesthetic). ? A medicine to make you fall asleep (general anesthetic). ? A medicine that is injected into an area of your body to numb everything below the injection site (regional  anesthetic).  An incision will be made over the lipoma or very near the lipoma. The incision may be made in a natural skin line or crease.  Tissues, nerves, and blood vessels near the lipoma will be moved out of the way.  The lipoma and the capsule that surrounds it will be separated from the surrounding tissues.  The lipoma will be removed.  The incision may be closed with stitches (sutures).  A bandage (dressing) will be placed over the  incision.

## 2020-04-25 ENCOUNTER — Other Ambulatory Visit: Payer: Self-pay

## 2020-04-25 ENCOUNTER — Ambulatory Visit: Payer: 59 | Admitting: Surgery

## 2020-04-25 ENCOUNTER — Ambulatory Visit (INDEPENDENT_AMBULATORY_CARE_PROVIDER_SITE_OTHER): Payer: 59 | Admitting: Surgery

## 2020-04-25 ENCOUNTER — Encounter: Payer: Self-pay | Admitting: Surgery

## 2020-04-25 VITALS — BP 119/75 | HR 91 | Temp 98.3°F | Ht 70.0 in | Wt 175.0 lb

## 2020-04-25 DIAGNOSIS — T8149XA Infection following a procedure, other surgical site, initial encounter: Secondary | ICD-10-CM

## 2020-04-25 DIAGNOSIS — D179 Benign lipomatous neoplasm, unspecified: Secondary | ICD-10-CM

## 2020-04-25 MED ORDER — SULFAMETHOXAZOLE-TRIMETHOPRIM 800-160 MG PO TABS: 1.0000 | ORAL_TABLET | Freq: Two times a day (BID) | ORAL | 0 refills | Status: DC

## 2020-04-25 NOTE — Patient Instructions (Addendum)
Keep the area covered while it is draining. Start the antibiotics. Use warm compresses to the area 2-3 times a day to help with drainage.  If the area is not getting better by Friday, call us to let us know. The answering service will get in contact with Dr Aleen Campi.  Follow up here in one week. If everything looks great just cancel the appointment.

## 2020-04-25 NOTE — Progress Notes (Signed)
04/25/2020  HPI: Alexander Turner is a 54 y.o. male s/p excision of right hip, anterior abdominal wall, and chest wall lipomas on 04/04/20.  He's also s/p excision of lipomas of the left hip and thigh areas on 03/12/20.  He presents today for follow up and also because he's having wound infection from two of the left thigh and hip wounds.  Reports the distal left thigh wound started draining purulent fluid recently and is red.  The left hip wound is red but not draining.  Vital signs: BP 119/75   Pulse 91   Temp 98.3 F (36.8 C)   Ht 5\' 10"  (1.778 m)   Wt 175 lb (79.4 kg)   SpO2 97%   BMI 25.11 kg/m    Physical Exam: Constitutional: No acute distress Skin:  Right sided incisions are healing well, without evidence of infection.  Distal left thigh wound and left hip wound show mild surrounding blanching erythema, but no active drainage.  Assessment/Plan: This is a 54 y.o. male s/p excision of multiple lipomas, with wound infection of two wounds on left thigh/hip.  --Unclear why more than 6 weeks out he would get a wound infection.  Could be related to the steroid ointment treatment he got due to the rash.  Will treat with po Bactrim for 7 day course.  He will call in two days if there is no improvement to change abx. --Follow up next week.   57, MD [facility]

## 2020-05-02 ENCOUNTER — Encounter: Payer: Self-pay | Admitting: Surgery

## 2020-05-02 ENCOUNTER — Other Ambulatory Visit: Payer: Self-pay

## 2020-05-02 ENCOUNTER — Ambulatory Visit (INDEPENDENT_AMBULATORY_CARE_PROVIDER_SITE_OTHER): Payer: BC Managed Care – PPO | Admitting: Surgery

## 2020-05-02 VITALS — BP 117/76 | HR 75 | Temp 98.0°F | Ht 70.0 in | Wt 175.0 lb

## 2020-05-02 DIAGNOSIS — T8149XA Infection following a procedure, other surgical site, initial encounter: Secondary | ICD-10-CM

## 2020-05-02 NOTE — Progress Notes (Signed)
05/02/2020  HPI: Alexander Turner is a 55 y.o. male s/p excision of lipomas of the left thigh and hip.  He was seen on 12/29 due to infection of two of the left incisions.  Was given Bactrim.  He's been doing well and is almost done with the prescription.  Denies any further purulent drainage and the wounds continue to heal well.  Vital signs: BP 117/76   Pulse 75   Temp 98 F (36.7 C)   Ht 5\' 10"  (1.778 m)   Wt 175 lb (79.4 kg)   SpO2 98%   BMI 25.11 kg/m    Physical Exam: Constitutional:  No acute distress Skin:  Incisions are healing well, with only some minimal surrounding erythema.  No further drainage or induration.  Assessment/Plan: This is a 55 y.o. male s/p excision of multiple lipomas of left thigh and hip.  --Patient is healing well, no further signs of infection.  Complete antibiotic course. --Follow up prn.   59, MD [facility]

## 2020-05-02 NOTE — Patient Instructions (Signed)
The areas should continue to heal, this may take 1-2 months. You may want to wear a Band-Aid on the area if it gets irritated during the day.   Follow-up with our office as needed.  Please call and ask to speak with a nurse if you develop questions or concerns.   May rub Vitamin-E oil or other emmolient agent in area 2-3 times a day to soften. You will need to use sunscreen for the next year on the area to minimize altered pigmentation of the site.

## 2021-01-02 ENCOUNTER — Encounter: Payer: Self-pay | Admitting: Urology

## 2021-01-02 ENCOUNTER — Ambulatory Visit (INDEPENDENT_AMBULATORY_CARE_PROVIDER_SITE_OTHER): Payer: BC Managed Care – PPO | Admitting: Urology

## 2021-01-02 ENCOUNTER — Other Ambulatory Visit: Payer: Self-pay

## 2021-01-02 VITALS — BP 119/71 | HR 91 | Ht 70.0 in | Wt 175.0 lb

## 2021-01-02 DIAGNOSIS — D294 Benign neoplasm of scrotum: Secondary | ICD-10-CM

## 2021-01-02 NOTE — Progress Notes (Signed)
   01/02/2021 6:07 PM   Belia Heman Turner Nov 10, 1965 [id number]  Referring provider: Kirk Ruths, MD [facility] [place],  [place] [address]  Chief Complaint  Patient presents with   Other    HPI: Alexander Turner is a 55 y.o. male who presents for evaluation of dark areas on scrotum which he noticed a few days ago.  Contacted Dr. Tonette Bihari office regarding direction and urology eval recommended No pain or discomfort No scrotal bleeding Has nocturia x2 Annual PSA checks have been normal   PMH: Past Medical History:  Diagnosis Date   Multiple lipomas    bilateral upper and lower extremities    Surgical History: Past Surgical History:  Procedure Laterality Date   COLONOSCOPY     KNEE SURGERY Right    LIPOMA EXCISION Right 02/08/2019   RUE x 3 lipomas   LIPOMA EXCISION Left 03/18/2019   LUE x 1 lipoma   NASAL ENDOSCOPY     TONSILLECTOMY      Home Medications:  Allergies as of 01/02/2021       Reactions   Doxycycline    Trazodone And Nefazodone    Chlorhexidine Gluconate Rash        Medication List        Accurate as of January 02, 2021  6:07 PM. If you have any questions, ask your nurse or doctor.          buPROPion 300 MG 24 hr tablet Commonly known as: WELLBUTRIN XL Take 300 mg by mouth daily.   celecoxib 100 MG capsule Commonly known as: CELEBREX Take 100 mg by mouth 2 (two) times daily.   fluticasone 50 MCG/ACT nasal spray Commonly known as: FLONASE Place 2 sprays into both nostrils daily.   mometasone 50 MCG/ACT nasal spray Commonly known as: NASONEX Place 2 sprays into the nose daily.   pantoprazole 40 MG tablet Commonly known as: PROTONIX pantoprazole 40 mg tablet,delayed release   sulfamethoxazole-trimethoprim 800-160 MG tablet Commonly known as: BACTRIM DS Take 1 tablet by mouth 2 (two) times daily.   zolpidem 10 MG tablet Commonly known as: AMBIEN zolpidem 10 mg tablet         Allergies:  Allergies  Allergen Reactions   Doxycycline    Trazodone And Nefazodone    Chlorhexidine Gluconate Rash    Family History: No family history on file.  Social History:  reports that he has never smoked. He has never used smokeless tobacco. He reports current alcohol use. He reports that he does not use drugs.   Physical Exam: BP 119/71   Pulse 91   Ht '5\' 10"'$  (1.778 m)   Wt 175 lb (79.4 kg)   BMI 25.11 kg/m   Constitutional:  Alert and oriented, No acute distress. HEENT: [place] AT, moist mucus membranes.  Trachea midline, no masses. Cardiovascular: No clubbing, cyanosis, or edema. Respiratory: Normal respiratory effort, no increased work of breathing. GU: Phallus without lesions, testes descended bilaterally without masses or tenderness.  Scattered scrotal angiokeratoma no largest left measuring approximately 2 mm Skin: No rashes, bruises or suspicious lesions.   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

## 2022-02-21 ENCOUNTER — Ambulatory Visit
Admission: RE | Admit: 2022-02-21 | Discharge: 2022-02-21 | Disposition: A | Discharge status: Home / Self Care | Payer: BC Managed Care – PPO | Source: Ambulatory Visit

## 2022-02-21 VITALS — BP 145/81 | HR 81 | Temp 97.9°F | Resp 15

## 2022-02-21 DIAGNOSIS — J019 Acute sinusitis, unspecified: Secondary | ICD-10-CM

## 2022-02-21 MED ORDER — PREDNISONE 10 MG (21) PO TBPK: ORAL_TABLET | Freq: Every day | ORAL | 0 refills | Status: DC

## 2022-02-21 MED ORDER — AZITHROMYCIN 250 MG PO TABS: ORAL_TABLET | ORAL | 0 refills | Status: DC

## 2022-02-21 NOTE — ED Triage Notes (Signed)
Pt. Presents to UC w/ c/o sinus pressure, nasal congestion and a headache for the past 5 days. Pt. Has been taking OTC medication.

## 2022-02-21 NOTE — Discharge Instructions (Addendum)
Follow up here or with your primary care provider if your symptoms are worsening or not improving with treatment.     

## 2022-02-21 NOTE — ED Provider Notes (Signed)
Roderic Palau    CSN: [id number] Arrival date & time: 02/21/22  2637      History   Chief Complaint Chief Complaint  Patient presents with   Nasal Congestion    Entered by patient    HPI Alexander Turner is a 56 y.o. male.   HPI  Presents to UC with complaint of URI symptoms x5 days.  Symptoms include sinus pressure, nasal congestion, headache.  Patient is using OTC medication for symptom control.  Nasal discharge is present and thick, yellow/green in color.  Denies fever, chills, myalgias, cough.  Past Medical History:  Diagnosis Date   Multiple lipomas    bilateral upper and lower extremities    Patient Active Problem List   Diagnosis Date Noted   Multiple lipomas 01/18/2019   Impingement syndrome of shoulder region 01/05/2019   Strain of trapezius muscle 12/23/2018   Medial epicondylitis 04/26/2018   Healthcare maintenance 07/14/2016   Insomnia 07/14/2016   Pure hypercholesterolemia 06/16/2014   Personal history of other malignant neoplasm of skin 04/27/2013    Past Surgical History:  Procedure Laterality Date   COLONOSCOPY     KNEE SURGERY Right    LIPOMA EXCISION Right 02/08/2019   RUE x 3 lipomas   LIPOMA EXCISION Left 03/18/2019   LUE x 1 lipoma   NASAL ENDOSCOPY     TONSILLECTOMY         Home Medications    Prior to Admission medications   Medication Sig Start Date End Date Taking? Authorizing Provider  celecoxib (CELEBREX) 100 MG capsule Take 100 mg by mouth 2 (two) times daily. 01/17/20   [provider]  fluticasone (FLONASE) 50 MCG/ACT nasal spray Place 2 sprays into both nostrils daily. 01/31/20   [provider]  mometasone (NASONEX) 50 MCG/ACT nasal spray Place 2 sprays into the nose daily.    [provider]  zolpidem (AMBIEN) 10 MG tablet zolpidem 10 mg tablet 08/13/18   [provider]    Family History No family history on file.  Social History Social History   Tobacco Use    Smoking status: Never   Smokeless tobacco: Never  Substance Use Topics   Alcohol use: Yes   Drug use: Never     Allergies   Doxycycline, Trazodone and nefazodone, and Chlorhexidine gluconate   Review of Systems Review of Systems   Physical Exam Triage Vital Signs ED Triage Vitals  Enc Vitals Group     BP      Pulse      Resp      Temp      Temp src      SpO2      Weight      Height      Head Circumference      Peak Flow      Pain Score      Pain Loc      Pain Edu?      Excl. in [place]?    No data found.  Updated Vital Signs There were no vitals taken for this visit.  Visual Acuity Right Eye Distance:   Left Eye Distance:   Bilateral Distance:    Right Eye Near:   Left Eye Near:    Bilateral Near:     Physical Exam Vitals reviewed.  Constitutional:      Appearance: Normal appearance.  HENT:     Head: Normocephalic and atraumatic.     Right Ear: Tympanic membrane normal.  Left Ear: Tympanic membrane normal.     Mouth/Throat:     Pharynx: No oropharyngeal exudate or posterior oropharyngeal erythema.  Eyes:     Conjunctiva/sclera: Conjunctivae normal.     Pupils: Pupils are equal, round, and reactive to light.  Cardiovascular:     Rate and Rhythm: Normal rate and regular rhythm.     Pulses: Normal pulses.     Heart sounds: Normal heart sounds.  Pulmonary:     Effort: Pulmonary effort is normal.     Breath sounds: Normal breath sounds.  Skin:    General: Skin is warm and dry.  Neurological:     General: No focal deficit present.     Mental Status: He is alert and oriented to person, place, and time.  Psychiatric:        Mood and Affect: Mood normal.        Behavior: Behavior normal.      UC Treatments / Results  Labs (all labs ordered are listed, but only abnormal results are displayed) Labs Reviewed - No data to display  EKG   Radiology No results found.  Procedures Procedures (including critical care time)  Medications Ordered  in UC Medications - No data to display  Initial Impression / Assessment and Plan / UC Course  I have reviewed the triage vital signs and the nursing notes.  Pertinent labs & imaging results that were available during my care of the patient were reviewed by me and considered in my medical decision making (see chart for details).   Viral versus bacterial rhinosinusitis.  Suspect viral etiology, however given 5 days of symptoms, would expect on treated development of secondary infection within a few days.  Will treat with azithromycin for prevention of secondary while relieving the sinus inflammation with steroid taper.   Final Clinical Impressions(s) / UC Diagnoses   Final diagnoses:  None   Discharge Instructions   None    ED Prescriptions   None    PDMP not reviewed this encounter.   Rose Phi, [place] 02/21/22 [phone]

## 2022-05-07 ENCOUNTER — Other Ambulatory Visit: Payer: Self-pay | Admitting: Internal Medicine

## 2022-05-07 DIAGNOSIS — R1012 Left upper quadrant pain: Secondary | ICD-10-CM

## 2022-05-15 ENCOUNTER — Ambulatory Visit
Admission: RE | Admit: 2022-05-15 | Discharge: 2022-05-15 | Disposition: A | Discharge status: Home / Self Care | Payer: BC Managed Care – PPO | Source: Ambulatory Visit | Attending: Internal Medicine | Admitting: Internal Medicine

## 2022-05-15 DIAGNOSIS — R1012 Left upper quadrant pain: Secondary | ICD-10-CM

## 2022-07-18 ENCOUNTER — Ambulatory Visit (INDEPENDENT_AMBULATORY_CARE_PROVIDER_SITE_OTHER): Payer: BC Managed Care – PPO | Admitting: Psychology

## 2022-07-18 DIAGNOSIS — F4323 Adjustment disorder with mixed anxiety and depressed mood: Secondary | ICD-10-CM | POA: Diagnosis not present

## 2022-07-18 NOTE — Progress Notes (Signed)
[facility] Counselor Initial Adult Exam  Name: Alexander Turner Date: 07/18/2022 MRN: [id number] DOB: 03/01/1966 PCP: Kirk Ruths, MD  Time spent: 45 mins  Guardian/Payee:  Pt   Paperwork requested: No   Reason for Visit /Presenting Problem: Pt presents for session, via WebEx video.  Pt grants consent for session, stating he is in his home with no one else present.  I shared with pt that I am in my office with no one else here.  Mental Status Exam: Appearance:   Casual     Behavior:  Appropriate  Motor:  Normal  Speech/Language:   Clear and Coherent  Affect:  Appropriate  Mood:  anxious and depressed  Thought process:  normal  Thought content:    WNL  Sensory/Perceptual disturbances:    WNL  Orientation:  oriented to person, place, and time/date  Attention:  Good  Concentration:  Good  Memory:  WNL  Fund of knowledge:   Good  Insight:    Good  Judgment:   Good  Impulse Control:  Good   Reported Symptoms:  Pt shares that he is really concerned about Alexander Turner; he has been diagnosed as bipolar and has not responded to treatment to this point.  Pt has paid for treatment in several facilities over the year.  Pt has used Ambien (prn) for sleep over the years; Trazodone for sleep as well; pt is also taking Xanax prn and has used 2 pills in the past month.  Has tried Lexapro but did not like the sexual side effects and did not think it was beneficial for him   Risk Assessment: Danger to Self:  No; pt shares he would never attempt to harm himself; sometimes he thinks about how it would be for him to not be here Self-injurious Behavior: No Danger to Others: No Duty to Warn:no Physical Aggression / Violence:No  Access to Firearms a concern: Yes ; owns a hand gun that is locked up and not loaded Gang Involvement:No  Patient / guardian was educated about steps to take if suicide or homicide risk level increases between visits: yes While future psychiatric  events cannot be accurately predicted, the patient does not currently require acute inpatient psychiatric care and does not currently meet [facility] involuntary commitment criteria.  Substance Abuse History: Current substance abuse: Yes ; pt socially drinks and manages his drinking appropriately    Past Psychiatric History:   Previous psychological history is significant for depression Outpatient Providers:One previous therapist  History of Psych Hospitalization: No  Psychological Testing:  none    Abuse History:  Victim of: No.,  none    Report needed: No. Victim of Neglect:No. Perpetrator of  none   Witness / Exposure to Domestic Violence: No   Protective Services Involvement: No  Witness to [employer] Violence:  No   Family History: No family history on file.  Living situation: the patient lives with their family  Sexual Orientation: Straight  Relationship Status: married for 30 yrs; met in college at [employer] Name of spouse / other: Alexander Turner-- If a parent, number of children / ages:  Alexander Turner- 56 yo son; attended some schools but nothing stuck; Alexander Turner--60 yo daughter in [place], [place]--is a school Pharmacist, hospital; went to [employer]; she is getting married this summer  Support Systems: spouse Friends Alexander Turner and Alexander Turner Parents are divorced (59 yo and 79 yo)  Museum/gallery curator Stress:  No ; some conversations about how money is spent but they have money they can  spend  Income/Employment/Disability: Employment with a Management consultant; in Garment/textile technologist for 30 yrs; negotiates with insurance companies; 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 Service: No   Educational History: Education: Scientist, product/process development: Protestant; has not attended church regularly in about 5 yrs  Any cultural differences that may affect / interfere with treatment:  not applicable   Recreation/Hobbies: Pt enjoys golf; does not allow it to frustrate him; walking the dog; travelling  with Alexander Turner; exercising with a Music therapist  Stressors: Marital or family conflict    Strengths: Supportive Relationships, Family, and Friends  Barriers:  None noted   Legal History: Pending legal issue / charges: The patient has no significant history of legal issues. History of legal issue / charges:  none  Medical History/Surgical History: reviewed Past Medical History:  Diagnosis Date   Multiple lipomas    bilateral upper and lower extremities    Past Surgical History:  Procedure Laterality Date   COLONOSCOPY     KNEE SURGERY Right    LIPOMA EXCISION Right 02/08/2019   RUE x 3 lipomas   LIPOMA EXCISION Left 03/18/2019   LUE x 1 lipoma   NASAL ENDOSCOPY     TONSILLECTOMY      Medications: Current Outpatient Medications  Medication Sig Dispense Refill   albuterol (VENTOLIN HFA) 108 (90 Base) MCG/ACT inhaler Inhale into the lungs.     Azelastine HCl 137 MCG/SPRAY SOLN      azithromycin (ZITHROMAX Z-PAK) 250 MG tablet Take 2 tablets (500 mg) today, then 1 tablet (250 mg) for next 4 days. 6 tablet 0   brompheniramine-pseudoephedrine-DM (BROMFED DM) 30-2-10 MG/5ML syrup Bromfed DM Take 10 mL (oral) every 6 hours PRN - Cough [id number] syrup every 6 hours oral No set duration recorded days active 2-30-10 mg/5 mL     buPROPion (WELLBUTRIN XL) 300 MG 24 hr tablet Take by mouth.     celecoxib (CELEBREX) 100 MG capsule Take 100 mg by mouth 2 (two) times daily.     escitalopram (LEXAPRO) 10 MG tablet      fluticasone (FLONASE) 50 MCG/ACT nasal spray Place 2 sprays into both nostrils daily.     HYDROcodone-acetaminophen (NORCO/VICODIN) 5-325 MG tablet Take by mouth.     meloxicam (MOBIC) 15 MG tablet Take 1 tablet every day by oral route.     mometasone (NASONEX) 50 MCG/ACT nasal spray Place 2 sprays into the nose daily.     mupirocin ointment (BACTROBAN) 2 % Apply topically 2 (two) times daily.     oseltamivir (TAMIFLU) 75 MG capsule Tamiflu Take 1 capsule (oral) 2 times per day  for 5 days [id number] capsule 2 times per day oral 5 days active 75 MG     predniSONE (STERAPRED UNI-PAK 21 TAB) 10 MG (21) TBPK tablet Take by mouth daily. Take 6 tabs by mouth daily for 1 day, then 5 tabs for 1 day, then 4 tabs for 1 day, then 3 tabs for 1 day, then 2 tabs for 1 day, then 1 tab by mouth daily for 1 day 21 tablet 0   Ruxolitinib Phosphate (OPZELURA) 1.5 % CREA      traZODone (DESYREL) 50 MG tablet Take 50-100 mg by mouth at bedtime.     zolpidem (AMBIEN) 10 MG tablet zolpidem 10 mg tablet     No current facility-administered medications for this visit.    Allergies  Allergen Reactions   Doxycycline    Trazodone And Nefazodone    Chlorhexidine  Rash   Chlorhexidine Gluconate Rash    Diagnoses:  Adjustment disorder with mixed anxiety and depressed mood  Plan of Care: Asked pt to continue with his self care activities until our follow up session in 2 wks in the office (07/29/22).   Ivan Anchors, [facility]

## 2022-07-29 ENCOUNTER — Ambulatory Visit (INDEPENDENT_AMBULATORY_CARE_PROVIDER_SITE_OTHER): Payer: BC Managed Care – PPO | Admitting: Psychology

## 2022-07-29 DIAGNOSIS — F4323 Adjustment disorder with mixed anxiety and depressed mood: Secondary | ICD-10-CM | POA: Diagnosis not present

## 2022-07-29 NOTE — Progress Notes (Signed)
[facility] Counselor/Therapist Progress Note  Patient ID: Alexander Turner, MRN: [id number],    Date: 07/29/2022  Time Spent: 45 mins  Treatment Type: Individual Therapy  Reported Symptoms: Pt presents to the office for this session, granting consent as well.  Mental Status Exam: Appearance:  Casual     Behavior: Appropriate  Motor: Normal  Speech/Language:  Clear and Coherent  Affect: Appropriate  Mood: normal  Thought process: normal  Thought content:   WNL  Sensory/Perceptual disturbances:   WNL  Orientation: oriented to person, place, and time/date  Attention: Good  Concentration: Good  Memory: WNL  Fund of knowledge:  Good  Insight:   Good  Judgment:  Good  Impulse Control: Good   Risk Assessment: Danger to Self:  No Self-injurious Behavior: No Danger to Others: No Duty to Warn:no Physical Aggression / Violence:No  Access to Firearms a concern: No  Gang Involvement:No   Subjective: Pt shares that he just came back from [place] to see his daughter and future son-in-law over Easter.  He is not happy with the future son-in-law; he has tattoos and she does not; they met on line.  He also wants to talk about an "indiscretion" he had with another woman and his wife is aware of it; happened in the past 45-60 days.  Pt shares that there was a lot of alcohol involved.  Pt shares his parents got married after they learned his mom was pregnant.  His father came from a broken family and was hard on him when he was growing up.  His parents divorced after 78 yrs of marriage.  He has a younger sister.  He is still concerned about [name] and how he is doing.  He is also concerned about the stressful nature of his job.  His daughter and her coming wedding in July.  Also, his concern for his wife.  Talked with pt about at length about the benefits of self care activities.  Encouraged pt to think about what he wants his to be, in addition to golf, the gym, and walking his  dog.  Encouraged pt to be present and intentional with these activities.  We will meet in 2 wks for a follow up session   Interventions: Cognitive Behavioral Therapy  Diagnosis:Adjustment disorder with mixed anxiety and depressed mood  Plan: Treatment Plan Strengths/Abilities:  Intelligent, Intuitive, Willing to participate in therapy Treatment Preferences:  Outpatient Individual Therapy Statement of Needs:  Patient is to use CBT, mindfulness and coping skills to help manage and/or decrease symptoms associated with their diagnosis. Symptoms:  Depressed/Irritable mood, worry, social withdrawal Problems Addressed:  Depressive thoughts, Sadness, Sleep issues, etc. Long Term Goals:  Pt to reduce overall level, frequency, and intensity of the feelings of depression/anxiety as evidenced by decreased irritability, negative self talk, and helpless feelings from 6 to 7 days/week to 0 to 1 days/week, per client report, for at least 3 consecutive months.  Progress: 20% Short Term Goals:  Pt to verbally express understanding of the relationship between feelings of depression/anxiety and their impact on thinking patterns and behaviors.  Pt to verbalize an understanding of the role that distorted thinking plays in creating fears, excessive worry, and ruminations.  Progress: 20% Target Date:  07/29/2023 Frequency:  Bi-weekly Modality:  Cognitive Behavioral Therapy Interventions by Therapist:  Therapist will use CBT, Mindfulness exercises, Coping skills and Referrals, as needed by client. Client has verbally approved this treatment plan.  Ivan Anchors, [facility]

## 2022-08-12 ENCOUNTER — Ambulatory Visit: Payer: BC Managed Care – PPO | Admitting: Behavioral Health

## 2022-08-12 ENCOUNTER — Ambulatory Visit (INDEPENDENT_AMBULATORY_CARE_PROVIDER_SITE_OTHER): Payer: BC Managed Care – PPO | Admitting: Psychology

## 2022-08-12 DIAGNOSIS — F4323 Adjustment disorder with mixed anxiety and depressed mood: Secondary | ICD-10-CM | POA: Diagnosis not present

## 2022-08-12 NOTE — Progress Notes (Signed)
[facility] Counselor/Therapist Progress Note  Patient ID: Alexander Turner, MRN: [id number],    Date: 08/12/2022  Time Spent: 45 mins  Treatment Type: Individual Therapy  Reported Symptoms: Pt presents to the office for this session, granting consent as well.  Mental Status Exam: Appearance:  Casual     Behavior: Appropriate  Motor: Normal  Speech/Language:  Clear and Coherent  Affect: Appropriate  Mood: normal  Thought process: normal  Thought content:   WNL  Sensory/Perceptual disturbances:   WNL  Orientation: oriented to person, place, and time/date  Attention: Good  Concentration: Good  Memory: WNL  Fund of knowledge:  Good  Insight:   Good  Judgment:  Good  Impulse Control: Good   Risk Assessment: Danger to Self:  No Self-injurious Behavior: No Danger to Others: No Duty to Warn:no Physical Aggression / Violence:No  Access to Firearms a concern: No  Gang Involvement:No   Subjective: Pt shares that he has "been moderately successful in engaging in some self care activities."  Pt shares that he talked with his wife [name]) about him engaging in self care activities for himself.  He admits that he is a bit resentful of her life as a stay at home mom with few responsibilities.  He wants to talk more about [name] and [name].  [name] is coming for a bridal shower in May and pt wants to talk with her then about his concerns about [name].  Talked with pt about how to present pt's concerns for her and about [name]. Talked with pt about the presentation of his concerns from the perspective of his love for [name].  Encouraged pt to be present and intentional with these activities.  We will meet in 2 wks for a follow up session.  Interventions: Cognitive Behavioral Therapy  Diagnosis:Adjustment disorder with mixed anxiety and depressed mood  Plan: Treatment Plan Strengths/Abilities:  Intelligent, Intuitive, Willing to participate in therapy Treatment  Preferences:  Outpatient Individual Therapy Statement of Needs:  Patient is to use CBT, mindfulness and coping skills to help manage and/or decrease symptoms associated with their diagnosis. Symptoms:  Depressed/Irritable mood, worry, social withdrawal Problems Addressed:  Depressive thoughts, Sadness, Sleep issues, etc. Long Term Goals:  Pt to reduce overall level, frequency, and intensity of the feelings of depression/anxiety as evidenced by decreased irritability, negative self talk, and helpless feelings from 6 to 7 days/week to 0 to 1 days/week, per client report, for at least 3 consecutive months.  Progress: 20% Short Term Goals:  Pt to verbally express understanding of the relationship between feelings of depression/anxiety and their impact on thinking patterns and behaviors.  Pt to verbalize an understanding of the role that distorted thinking plays in creating fears, excessive worry, and ruminations.  Progress: 20% Target Date:  07/29/2023 Frequency:  Bi-weekly Modality:  Cognitive Behavioral Therapy Interventions by Therapist:  Therapist will use CBT, Mindfulness exercises, Coping skills and Referrals, as needed by client. Client has verbally approved this treatment plan.  [name], [facility]

## 2022-08-26 ENCOUNTER — Ambulatory Visit (INDEPENDENT_AMBULATORY_CARE_PROVIDER_SITE_OTHER): Payer: BC Managed Care – PPO | Admitting: Psychology

## 2022-08-26 DIAGNOSIS — F4323 Adjustment disorder with mixed anxiety and depressed mood: Secondary | ICD-10-CM

## 2022-08-26 NOTE — Progress Notes (Signed)
[facility] Counselor/Therapist Progress Note  Patient ID: Alexander Turner, MRN: [id number],    Date: 08/26/2022  Time Spent: 45 mins  Treatment Type: Individual Therapy  Reported Symptoms: Pt presents to the office for this session, granting consent as well.  Mental Status Exam: Appearance:  Casual     Behavior: Appropriate  Motor: Normal  Speech/Language:  Clear and Coherent  Affect: Appropriate  Mood: normal  Thought process: normal  Thought content:   WNL  Sensory/Perceptual disturbances:   WNL  Orientation: oriented to person, place, and time/date  Attention: Good  Concentration: Good  Memory: WNL  Fund of knowledge:  Good  Insight:   Good  Judgment:  Good  Impulse Control: Good   Risk Assessment: Danger to Self:  No Self-injurious Behavior: No Danger to Others: No Duty to Warn:no Physical Aggression / Violence:No  Access to Firearms a concern: No  Gang Involvement:No   Subjective: Note: session began at 4pm; power was out so note was done later.  Pt shares that he has "been OK since our last session.  We had a wedding that we went to last weekend and the woman I had an affair with was there with her husband and Alexander Turner and I ran into them; it was tense to say the least."  Pt indicates they travel in the same social circle.  Alexander Turner has wanted to know why pt cheated; we talked about possible motives for her wanting that info.  Encouraged pt to continue to engage in conversations with her on a wide variety of issues but also encouraged pt to use a "time out word" if either of them needs it during their talks.  Also encouraged pt to give himself grace as he is juggling quite a bit of issues right now.  Alexander Turner will be home this weekend with her maid of honor and pt hopes to be able to talk with her about his concerns for her; he is clear that he wants the best for her and wants her to have a great life.  Encouraged pt to continue with his self care activities  and we will meet in 2 wks for a follow up session.  Interventions: Cognitive Behavioral Therapy  Diagnosis:Adjustment disorder with mixed anxiety and depressed mood  Plan: Treatment Plan Strengths/Abilities:  Intelligent, Intuitive, Willing to participate in therapy Treatment Preferences:  Outpatient Individual Therapy Statement of Needs:  Patient is to use CBT, mindfulness and coping skills to help manage and/or decrease symptoms associated with their diagnosis. Symptoms:  Depressed/Irritable mood, worry, social withdrawal Problems Addressed:  Depressive thoughts, Sadness, Sleep issues, etc. Long Term Goals:  Pt to reduce overall level, frequency, and intensity of the feelings of depression/anxiety as evidenced by decreased irritability, negative self talk, and helpless feelings from 6 to 7 days/week to 0 to 1 days/week, per client report, for at least 3 consecutive months.  Progress: 20% Short Term Goals:  Pt to verbally express understanding of the relationship between feelings of depression/anxiety and their impact on thinking patterns and behaviors.  Pt to verbalize an understanding of the role that distorted thinking plays in creating fears, excessive worry, and ruminations.  Progress: 20% Target Date:  07/29/2023 Frequency:  Bi-weekly Modality:  Cognitive Behavioral Therapy Interventions by Therapist:  Therapist will use CBT, Mindfulness exercises, Coping skills and Referrals, as needed by client. Client has verbally approved this treatment plan.  Alexander Turner, [facility]

## 2022-09-11 ENCOUNTER — Ambulatory Visit (INDEPENDENT_AMBULATORY_CARE_PROVIDER_SITE_OTHER): Payer: BC Managed Care – PPO | Admitting: Psychology

## 2022-09-11 DIAGNOSIS — F4323 Adjustment disorder with mixed anxiety and depressed mood: Secondary | ICD-10-CM

## 2022-09-11 NOTE — Progress Notes (Signed)
[facility] Counselor/Therapist Progress Note  Patient ID: Alexander Turner, MRN: [id number],    Date: 09/11/2022  Time Spent: 60 mins  Treatment Type: Individual Therapy  Reported Symptoms: Pt presents to the office for this session, granting consent as well.  Mental Status Exam: Appearance:  Casual     Behavior: Appropriate  Motor: Normal  Speech/Language:  Clear and Coherent  Affect: Appropriate  Mood: normal  Thought process: normal  Thought content:   WNL  Sensory/Perceptual disturbances:   WNL  Orientation: oriented to person, place, and time/date  Attention: Good  Concentration: Good  Memory: WNL  Fund of knowledge:  Good  Insight:   Good  Judgment:  Good  Impulse Control: Good   Risk Assessment: Danger to Self:  No Self-injurious Behavior: No Danger to Others: No Duty to Warn:no Physical Aggression / Violence:No  Access to Firearms a concern: No  Gang Involvement:No   Subjective: Pt shares that [name] came home this past weekend for her bridal shower and pt enjoyed having her here.  Pt also shares that they had gone to [place] since our last session and he had a great conversation with [name] and [name] and pt feels much better about them getting married (7/13 in [place], [place]) and their lives moving forward.  Pt shares that he has struggled with carving out self care time since our last session; he continues to have trouble falling asleep and continues to use sleep medicine to help him fall asleep; he does not sleep through the night very frequently.  Talked with pt about the option of him taking a couple of days off from work in the next few weeks; encouraged pt to use the time to be intentional about taking the time be present for the 4 days.  Pt shares that he has never taken 4 days in his life away before and is wondering what he would like to do.  Encouraged pt to continue with his self care activities and we will meet in 2 wks for a  follow up session.  Interventions: Cognitive Behavioral Therapy  Diagnosis:Adjustment disorder with mixed anxiety and depressed mood  Plan: Treatment Plan Strengths/Abilities:  Intelligent, Intuitive, Willing to participate in therapy Treatment Preferences:  Outpatient Individual Therapy Statement of Needs:  Patient is to use CBT, mindfulness and coping skills to help manage and/or decrease symptoms associated with their diagnosis. Symptoms:  Depressed/Irritable mood, worry, social withdrawal Problems Addressed:  Depressive thoughts, Sadness, Sleep issues, etc. Long Term Goals:  Pt to reduce overall level, frequency, and intensity of the feelings of depression/anxiety as evidenced by decreased irritability, negative self talk, and helpless feelings from 6 to 7 days/week to 0 to 1 days/week, per client report, for at least 3 consecutive months.  Progress: 20% Short Term Goals:  Pt to verbally express understanding of the relationship between feelings of depression/anxiety and their impact on thinking patterns and behaviors.  Pt to verbalize an understanding of the role that distorted thinking plays in creating fears, excessive worry, and ruminations.  Progress: 20% Target Date:  07/29/2023 Frequency:  Bi-weekly Modality:  Cognitive Behavioral Therapy Interventions by Therapist:  Therapist will use CBT, Mindfulness exercises, Coping skills and Referrals, as needed by client. Client has verbally approved this treatment plan.  [name], [facility]

## 2022-09-23 ENCOUNTER — Ambulatory Visit (INDEPENDENT_AMBULATORY_CARE_PROVIDER_SITE_OTHER): Payer: BC Managed Care – PPO | Admitting: Psychology

## 2022-09-23 DIAGNOSIS — F4323 Adjustment disorder with mixed anxiety and depressed mood: Secondary | ICD-10-CM | POA: Diagnosis not present

## 2022-09-23 NOTE — Progress Notes (Signed)
[facility] Counselor/Therapist Progress Note  Patient ID: Alexander Turner, MRN: [id number],    Date: 09/23/2022  Time Spent: 60 mins  Treatment Type: Individual Therapy  Reported Symptoms: Pt presents for the session, via Care[employer] video, granting consent as well.  Pt shares he is in his home with no one else present.  I shared with pt that I am in my office with no one else here either.  Mental Status Exam: Appearance:  Casual     Behavior: Appropriate  Motor: Normal  Speech/Language:  Clear and Coherent  Affect: Appropriate  Mood: normal  Thought process: normal  Thought content:   WNL  Sensory/Perceptual disturbances:   WNL  Orientation: oriented to person, place, and time/date  Attention: Good  Concentration: Good  Memory: WNL  Fund of knowledge:  Good  Insight:   Good  Judgment:  Good  Impulse Control: Good   Risk Assessment: Danger to Self:  No Self-injurious Behavior: No Danger to Others: No Duty to Warn:no Physical Aggression / Violence:No  Access to Firearms a concern: No  Gang Involvement:No   Subjective: Pt shares that he went to [facility] for the weekend with [name] and their best couple friends.  He feels that he was able to recharge his batteries.  He also was able to play golf yesterday and is scheduled to play golf this Friday and next weekend.  He is only working 3 days this week.  Pt shares that he and [name] were asked by their friends about what is going on with them and this other couple in their friend group [place] and [place]).  Pt stepped around the issue with them without giving too much information.  Pt shares that he and [name] did not talk about this situation on their way home.  Pt shares that Sunday night [name] got emotional with them about the golfer who committed suicide Saturday morning and they were concerned about him.  [name]'s father was in the hospital this past weekend for COPD (still smokes).  Pt and [name] continue to be stressed  by the wedding planning (7/13 in [place]), weather concerns, etc.  Pt wants to be sure that plans are in place for contingencies.  Pt shares that work is going OK; "it has not been as intense in the last couple of weeks and that is good."  Pt shares that he has been intentional about taking last Friday and this coming Friday and having yesterday as a holiday.  Talked with pt about using music to help calm him down with he starts worrying about all of his concerns.  Pt shares that [name] is at her parent's home helping to take care of her father who got home from the hospital yesterday.  Encouraged pt to continue with his self care activities and we will meet in 2 wks for a follow up session.  Interventions: Cognitive Behavioral Therapy  Diagnosis:Adjustment disorder with mixed anxiety and depressed mood  Plan: Treatment Plan Strengths/Abilities:  Intelligent, Intuitive, Willing to participate in therapy Treatment Preferences:  Outpatient Individual Therapy Statement of Needs:  Patient is to use CBT, mindfulness and coping skills to help manage and/or decrease symptoms associated with their diagnosis. Symptoms:  Depressed/Irritable mood, worry, social withdrawal Problems Addressed:  Depressive thoughts, Sadness, Sleep issues, etc. Long Term Goals:  Pt to reduce overall level, frequency, and intensity of the feelings of depression/anxiety as evidenced by decreased irritability, negative self talk, and helpless feelings from 6 to 7 days/week  to 0 to 1 days/week, per client report, for at least 3 consecutive months.  Progress: 20% Short Term Goals:  Pt to verbally express understanding of the relationship between feelings of depression/anxiety and their impact on thinking patterns and behaviors.  Pt to verbalize an understanding of the role that distorted thinking plays in creating fears, excessive worry, and ruminations.  Progress: 20% Target Date:  07/29/2023 Frequency:  Bi-weekly Modality:   Cognitive Behavioral Therapy Interventions by Therapist:  Therapist will use CBT, Mindfulness exercises, Coping skills and Referrals, as needed by client. Client has verbally approved this treatment plan.  [name], [facility]

## 2022-10-09 ENCOUNTER — Ambulatory Visit (INDEPENDENT_AMBULATORY_CARE_PROVIDER_SITE_OTHER): Payer: BC Managed Care – PPO | Admitting: Psychology

## 2022-10-09 DIAGNOSIS — F4323 Adjustment disorder with mixed anxiety and depressed mood: Secondary | ICD-10-CM

## 2022-10-09 NOTE — Progress Notes (Signed)
[facility] Counselor/Therapist Progress Note  Patient ID: Alexander Turner, MRN: [id number],    Date: 10/09/2022  Time Spent: 60 mins  Treatment Type: Individual Therapy  Reported Symptoms: Pt presents for the session in person, granting consent for the session.    Mental Status Exam: Appearance:  Casual     Behavior: Appropriate  Motor: Normal  Speech/Language:  Clear and Coherent  Affect: Appropriate  Mood: normal  Thought process: normal  Thought content:   WNL  Sensory/Perceptual disturbances:   WNL  Orientation: oriented to person, place, and time/date  Attention: Good  Concentration: Good  Memory: WNL  Fund of knowledge:  Good  Insight:   Good  Judgment:  Good  Impulse Control: Good   Risk Assessment: Danger to Self:  No Self-injurious Behavior: No Danger to Others: No Duty to Warn:no Physical Aggression / Violence:No  Access to Firearms a concern: No  Gang Involvement:No   Subjective: Pt shares that they are 30 days from the wedding date and are making final plans.  [name] is in [place] this week to meet with the minister, the flower person, etc.  Pt shares that [name] was not at work on Monday because he was "sick"; he did go on Tuesday.  He has said that his current job "is not my passion."  Pt is not sure what his passion is and talk with him about how to develop his passions.  Talked with pt about the option of asking [name] for permission to attend one of his therapy sessions with [name] to tell therapist what he is proud of [place] for and what he would like for [name] to work on in therapy.  Talked with pt about how to engage in conversation with [name] in ways that they have not tried before; talking with [name] about what he wants out of life, what is important to him, where he sees himself in 10-15 yrs, etc.?  Pt has continued to walk the dog daily and plays golf regularly; he is working on being present with each of these activities.   Encouraged pt to continue with his self care activities and we will meet in 2 wks for a follow up session.  Interventions: Cognitive Behavioral Therapy  Diagnosis:Adjustment disorder with mixed anxiety and depressed mood  Plan: Treatment Plan Strengths/Abilities:  Intelligent, Intuitive, Willing to participate in therapy Treatment Preferences:  Outpatient Individual Therapy Statement of Needs:  Patient is to use CBT, mindfulness and coping skills to help manage and/or decrease symptoms associated with their diagnosis. Symptoms:  Depressed/Irritable mood, worry, social withdrawal Problems Addressed:  Depressive thoughts, Sadness, Sleep issues, etc. Long Term Goals:  Pt to reduce overall level, frequency, and intensity of the feelings of depression/anxiety as evidenced by decreased irritability, negative self talk, and helpless feelings from 6 to 7 days/week to 0 to 1 days/week, per client report, for at least 3 consecutive months.  Progress: 20% Short Term Goals:  Pt to verbally express understanding of the relationship between feelings of depression/anxiety and their impact on thinking patterns and behaviors.  Pt to verbalize an understanding of the role that distorted thinking plays in creating fears, excessive worry, and ruminations.  Progress: 20% Target Date:  07/29/2023 Frequency:  Bi-weekly Modality:  Cognitive Behavioral Therapy Interventions by Therapist:  Therapist will use CBT, Mindfulness exercises, Coping skills and Referrals, as needed by client. Client has verbally approved this treatment plan.  [name], [facility]

## 2022-10-21 ENCOUNTER — Ambulatory Visit (INDEPENDENT_AMBULATORY_CARE_PROVIDER_SITE_OTHER): Payer: BC Managed Care – PPO | Admitting: Psychology

## 2022-10-21 DIAGNOSIS — F4323 Adjustment disorder with mixed anxiety and depressed mood: Secondary | ICD-10-CM

## 2022-10-21 NOTE — Progress Notes (Addendum)
[facility] Counselor/Therapist Progress Note  Patient ID: Alexander Turner, MRN: [id number],    Date: 10/21/2022  Time Spent: 50 mins; start time: 1602; stop time: 1653  Treatment Type: Individual Therapy  Reported Symptoms: Pt presents for the session via Caregility video  He states that he is in his home; he grants consent for the session and states that he understands the limits of virtual sessions.  I shared with pt that I am in my office with no one else present here.    Mental Status Exam: Appearance:  Casual     Behavior: Appropriate  Motor: Normal  Speech/Language:  Clear and Coherent  Affect: Appropriate  Mood: normal  Thought process: normal  Thought content:   WNL  Sensory/Perceptual disturbances:   WNL  Orientation: oriented to person, place, and time/date  Attention: Good  Concentration: Good  Memory: WNL  Fund of knowledge:  Good  Insight:   Good  Judgment:  Good  Impulse Control: Good   Risk Assessment: Danger to Self:  No Self-injurious Behavior: No Danger to Others: No Duty to Warn:no Physical Aggression / Violence:No  Access to Firearms a concern: No  Gang Involvement:No   Subjective: Pt shares that they are getting closer and closer to the wedding.  They are meeting with the wedding planner after today's session.  He is working on his speech for the rehearsal dinner and his speech for the reception.  He shares that Alexander Turner has lost his latest job; "it is just rinse and repeat with him."  He is concerned about how to be beneficial for him but believes he needs to focus on the wedding right now.  Alexander Turner continues to see a therapist in [place].  Encouraged pt to think about talking with Alexander Turner about pt and [facility] attending a therapy session with him after the wedding.  Alexander Turner attending a step down treatment program last year; Alexander Turner is talking about the possibility of going back to treatment.  Pt shares that he does not believe that Alexander Turner has  accountability to anyone or for anything.  Pt shares that he continues to walk the dog regularly and has played golf once recently.  He shares that his boss has had to take a leave of absence from work due to her husband having late stage cancer so he has greater responsibilities at work.  He spent time with a friend on Saturday evening and enjoyed that time.  Pt did a get a new prescription for Xanax from his PCP; first script of 15 pills lasted him for 6 months.  Encouraged pt to continue to engage with his self care activities and we will meet in 3 wks for a follow up session, after the wedding on 7/13.  Interventions: Cognitive Behavioral Therapy  Diagnosis:Adjustment disorder with mixed anxiety and depressed mood  Plan: Treatment Plan Strengths/Abilities:  Intelligent, Intuitive, Willing to participate in therapy Treatment Preferences:  Outpatient Individual Therapy Statement of Needs:  Patient is to use CBT, mindfulness and coping skills to help manage and/or decrease symptoms associated with their diagnosis. Symptoms:  Depressed/Irritable mood, worry, social withdrawal Problems Addressed:  Depressive thoughts, Sadness, Sleep issues, etc. Long Term Goals:  Pt to reduce overall level, frequency, and intensity of the feelings of depression/anxiety as evidenced by decreased irritability, negative self talk, and helpless feelings from 6 to 7 days/week to 0 to 1 days/week, per client report, for at least 3 consecutive months.  Progress: 20% Short Term Goals:  Pt to verbally  express understanding of the relationship between feelings of depression/anxiety and their impact on thinking patterns and behaviors.  Pt to verbalize an understanding of the role that distorted thinking plays in creating fears, excessive worry, and ruminations.  Progress: 20% Target Date:  07/29/2023 Frequency:  Bi-weekly Modality:  Cognitive Behavioral Therapy Interventions by Therapist:  Therapist will use CBT, Mindfulness  exercises, Coping skills and Referrals, as needed by client. Client has verbally approved this treatment plan.  [name], [facility]

## 2022-11-13 ENCOUNTER — Ambulatory Visit (INDEPENDENT_AMBULATORY_CARE_PROVIDER_SITE_OTHER): Payer: BC Managed Care – PPO | Admitting: Psychology

## 2022-11-13 DIAGNOSIS — F4323 Adjustment disorder with mixed anxiety and depressed mood: Secondary | ICD-10-CM

## 2022-11-13 NOTE — Progress Notes (Signed)
[facility] Counselor/Therapist Progress Note  Patient ID: Alexander Turner, MRN: [id number],    Date: 11/13/2022  Time Spent: 50 mins; start time: 0900; stop time: 0950  Treatment Type: Individual Therapy  Reported Symptoms: Pt presents for the session via Caregility video  He states that he is in his home; he grants consent for the session and states that he understands the limits of virtual sessions.  I shared with pt that I am in my office with no one else present here.    Mental Status Exam: Appearance:  Casual     Behavior: Appropriate  Motor: Normal  Speech/Language:  Clear and Coherent  Affect: Appropriate  Mood: normal  Thought process: normal  Thought content:   WNL  Sensory/Perceptual disturbances:   WNL  Orientation: oriented to person, place, and time/date  Attention: Good  Concentration: Good  Memory: WNL  Fund of knowledge:  Good  Insight:   Good  Judgment:  Good  Impulse Control: Good   Risk Assessment: Danger to Self:  No Self-injurious Behavior: No Danger to Others: No Duty to Warn:no Physical Aggression / Violence:No  Access to Firearms a concern: No  Gang Involvement:No   Subjective: Pt shares that "the wedding was a really nice event.  The weather cooperated and Alexander Turner was so happy.  Alexander Turner and I stayed an extra day to decompress and came back on Monday.  I got a bit emotional during the rehearsal dinner speech and we were able to have fun on Saturday."  Pt shares that he did not realize how much emotional space was being taken up by the wedding and the prep time for that.  He shares that their next focus is on [facility] and trying to figure out what the next best step is for him.  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  They are considering sending him to another treatment facility that is just for mental health.  He is spending the majority of his time everyday in his room.  Pt shares  that Alexander Turner is not at a place yet where she is willing to push Alexander Turner out of the nest.  Talked with pt about the possibility of additional psychological testing for Alexander Turner to better define what his issues are.  Encouraged pt to continue to engage with his self care activities and we will meet in 3 wks for a follow up session, after the wedding on 7/13.  Interventions: Cognitive Behavioral Therapy  Diagnosis:Adjustment disorder with mixed anxiety and depressed mood  Plan: Treatment Plan Strengths/Abilities:  Intelligent, Intuitive, Willing to participate in therapy Treatment Preferences:  Outpatient Individual Therapy Statement of Needs:  Patient is to use CBT, mindfulness and coping skills to help manage and/or decrease symptoms associated with their diagnosis. Symptoms:  Depressed/Irritable mood, worry, social withdrawal Problems Addressed:  Depressive thoughts, Sadness, Sleep issues, etc. Long Term Goals:  Pt to reduce overall level, frequency, and intensity of the feelings of depression/anxiety as evidenced by decreased irritability, negative self talk, and helpless feelings from 6 to 7 days/week to 0 to 1 days/week, per client report, for at least 3 consecutive months.  Progress: 20% Short Term Goals:  Pt to verbally express understanding of the relationship between feelings of depression/anxiety and their impact on thinking patterns and behaviors.  Pt to verbalize an understanding of the role that distorted thinking plays in creating fears, excessive worry, and ruminations.  Progress: 20% Target Date:  07/29/2023 Frequency:  Bi-weekly Modality:  Cognitive Behavioral Therapy Interventions by Therapist:  Therapist will use CBT, Mindfulness exercises, Coping skills and Referrals, as needed by client. Client has verbally approved this treatment plan.  Alexander Turner, [facility]

## 2022-11-25 ENCOUNTER — Ambulatory Visit: Payer: BC Managed Care – PPO | Admitting: Psychology

## 2022-11-25 DIAGNOSIS — F4323 Adjustment disorder with mixed anxiety and depressed mood: Secondary | ICD-10-CM | POA: Diagnosis not present

## 2022-11-25 NOTE — Progress Notes (Signed)
[facility] Counselor/Therapist Progress Note  Patient ID: JEFRE PAM, MRN: [id number],    Date: 11/25/2022  Time Spent: 50 mins; start time: 1600; stop time: 1650  Treatment Type: Individual Therapy  Reported Symptoms: Pt presents for the session via Caregility video  He states that he is in his home; he grants consent for the session and states that he understands the limits of virtual sessions.  I shared with pt that I am in my office with no one else present here.    Mental Status Exam: Appearance:  Casual     Behavior: Appropriate  Motor: Normal  Speech/Language:  Clear and Coherent  Affect: Appropriate  Mood: normal  Thought process: normal  Thought content:   WNL  Sensory/Perceptual disturbances:   WNL  Orientation: oriented to person, place, and time/date  Attention: Good  Concentration: Good  Memory: WNL  Fund of knowledge:  Good  Insight:   Good  Judgment:  Good  Impulse Control: Good   Risk Assessment: Danger to Self:  No Self-injurious Behavior: No Danger to Others: No Duty to Warn:no Physical Aggression / Violence:No  Access to Firearms a concern: No  Gang Involvement:No   Subjective: Pt shares that "there have been some ups and downs.  [name] and I have talked with [name] about what is next for him and he had not plans; he is just doing nothing.  We talked to pt about finding some kind of facility to allow him to reset.  [name] and [name] just walked back in the door having gone to [place] to interview the staff at [employer] in [place].  Apparently, [name] felt good about the visit.  I am not sure that [name] is not feeling great about that but he is not feeling great about just sitting around the house all day."  Pt shares he doubts [name]'s ability to get and keep a job; "I would not hire him right now."  Pt shares that he and [name] are just tired of trying to be supportive of [name] with him having no motivation to do anything different  for him.  Suggested for pt and [name] to attend [name]'s next therapy appt to try to get on the same page with the therapist to be beneficial for [facility].  Pt shares that [name] is doing well and is enjoying being married; they just bought a used Pel[employer] to try to get into better shape.  Pt shares he and [name] continue to spend time with friends on the weekends; the event this past weekend involved [place] and her husband so it was a bit more stressful.  Pt continues to walk the dog regularly and enjoys that.  Encouraged pt to continue to engage with his self care activities as intentionally as he can and we will meet in 2 wks for a follow up session.  Interventions: Cognitive Behavioral Therapy  Diagnosis:Adjustment disorder with mixed anxiety and depressed mood  Plan: Treatment Plan Strengths/Abilities:  Intelligent, Intuitive, Willing to participate in therapy Treatment Preferences:  Outpatient Individual Therapy Statement of Needs:  Patient is to use CBT, mindfulness and coping skills to help manage and/or decrease symptoms associated with their diagnosis. Symptoms:  Depressed/Irritable mood, worry, social withdrawal Problems Addressed:  Depressive thoughts, Sadness, Sleep issues, etc. Long Term Goals:  Pt to reduce overall level, frequency, and intensity of the feelings of depression/anxiety as evidenced by decreased irritability, negative self talk, and helpless feelings from 6 to 7 days/week to 0 to 1 days/week, per  client report, for at least 3 consecutive months.  Progress: 20% Short Term Goals:  Pt to verbally express understanding of the relationship between feelings of depression/anxiety and their impact on thinking patterns and behaviors.  Pt to verbalize an understanding of the role that distorted thinking plays in creating fears, excessive worry, and ruminations.  Progress: 20% Target Date:  07/29/2023 Frequency:  Bi-weekly Modality:  Cognitive Behavioral Therapy Interventions by  Therapist:  Therapist will use CBT, Mindfulness exercises, Coping skills and Referrals, as needed by client. Client has verbally approved this treatment plan.  [name], [facility]

## 2022-12-09 ENCOUNTER — Ambulatory Visit (INDEPENDENT_AMBULATORY_CARE_PROVIDER_SITE_OTHER): Payer: BC Managed Care – PPO | Admitting: Psychology

## 2022-12-09 DIAGNOSIS — F4323 Adjustment disorder with mixed anxiety and depressed mood: Secondary | ICD-10-CM

## 2022-12-09 NOTE — Progress Notes (Signed)
[facility] Counselor/Therapist Progress Note  Patient ID: Alexander Turner, MRN: [id number],    Date: 12/09/2022  Time Spent: 50 mins; start time: 1600; stop time: 1650  Treatment Type: Individual Therapy  Reported Symptoms: Pt presents for the session via Caregility video  He states that he is in his home; he grants consent for the session and states that he understands the limits of virtual sessions.  I shared with pt that I am in my office with no one else present here.    Mental Status Exam: Appearance:  Casual     Behavior: Appropriate  Motor: Normal  Speech/Language:  Clear and Coherent  Affect: Appropriate  Mood: normal  Thought process: normal  Thought content:   WNL  Sensory/Perceptual disturbances:   WNL  Orientation: oriented to person, place, and time/date  Attention: Good  Concentration: Good  Memory: WNL  Fund of knowledge:  Good  Insight:   Good  Judgment:  Good  Impulse Control: Good   Risk Assessment: Danger to Self:  No Self-injurious Behavior: No Danger to Others: No Duty to Warn:no Physical Aggression / Violence:No  Access to Firearms a concern: No  Gang Involvement:No   Subjective: Pt shares that "there have been ongoing concerns for Alexander Turner.  I got some more information from Alexander Turner and Alexander Turner about the program in [place] and everyone feels pretty good about the program.  I talked specifically with Alexander Turner about his feelings about the possibility of his going to the program.  Alexander Turner admitted that he had stopped taking his medications on his own and had not told his psychiatrist or his therapist.  We are now watching him take his medications at 830am and 830pm.  It turns out that Alexander Turner had misread the medication bottles and was taking too much of one and not enough of the other."  Alexander Turner does indicate that he is willing to go but questions what will make this stay different from the last treatment episode.  Pt shares that he has shared with  Alexander Turner that he will get out of the program what he puts into it.  All three of them are going to [place] tomorrow to take [place] Turner to the facility.  The program will be 45-90 days for the first segment which is very structured.  He will then go to a halfway house working toward independent living and they still go back to the facility for classes during the week; he will also be required to get a job once he gets there.  Pt shares that insurance will pay for a portion of the program but pt will have financial responsibility as well.  Encouraged pt to give himself credit for doing everything he can to help Alexander Turner be successful; it will now be Alexander Turner's responsibility to be successful moving forward.  Pt continues to walk the dog regularly and enjoys that.  Encouraged pt to continue to engage with his self care activities as intentionally as he can and we will meet in 2 wks for a follow up session.  Interventions: Cognitive Behavioral Therapy  Diagnosis:Adjustment disorder with mixed anxiety and depressed mood  Plan: Treatment Plan Strengths/Abilities:  Intelligent, Intuitive, Willing to participate in therapy Treatment Preferences:  Outpatient Individual Therapy Statement of Needs:  Patient is to use CBT, mindfulness and coping skills to help manage and/or decrease symptoms associated with their diagnosis. Symptoms:  Depressed/Irritable mood, worry, social withdrawal Problems Addressed:  Depressive thoughts, Sadness, Sleep issues, etc. Long Term Goals:  Pt to  reduce overall level, frequency, and intensity of the feelings of depression/anxiety as evidenced by decreased irritability, negative self talk, and helpless feelings from 6 to 7 days/week to 0 to 1 days/week, per client report, for at least 3 consecutive months.  Progress: 20% Short Term Goals:  Pt to verbally express understanding of the relationship between feelings of depression/anxiety and their impact on thinking patterns and behaviors.  Pt to  verbalize an understanding of the role that distorted thinking plays in creating fears, excessive worry, and ruminations.  Progress: 20% Target Date:  07/29/2023 Frequency:  Bi-weekly Modality:  Cognitive Behavioral Therapy Interventions by Therapist:  Therapist will use CBT, Mindfulness exercises, Coping skills and Referrals, as needed by client. Client has verbally approved this treatment plan.  [name], [facility]

## 2022-12-25 ENCOUNTER — Ambulatory Visit (INDEPENDENT_AMBULATORY_CARE_PROVIDER_SITE_OTHER): Payer: BC Managed Care – PPO | Admitting: Psychology

## 2022-12-25 DIAGNOSIS — F4323 Adjustment disorder with mixed anxiety and depressed mood: Secondary | ICD-10-CM | POA: Diagnosis not present

## 2022-12-25 NOTE — Progress Notes (Signed)
[facility] Counselor/Therapist Progress Note  Patient ID: Alexander Turner, MRN: [id number],    Date: 12/25/2022  Time Spent: 50 mins; start time: 1300; stop time: 1350  Treatment Type: Individual Therapy  Reported Symptoms: Pt presents for the session via Caregility video  He states that he is in his home; he grants consent for the session and states that he understands the limits of virtual sessions.  I shared with pt that I am in my office with no one else present here.    Mental Status Exam: Appearance:  Casual     Behavior: Appropriate  Motor: Normal  Speech/Language:  Clear and Coherent  Affect: Appropriate  Mood: normal  Thought process: normal  Thought content:   WNL  Sensory/Perceptual disturbances:   WNL  Orientation: oriented to person, place, and time/date  Attention: Good  Concentration: Good  Memory: WNL  Fund of knowledge:  Good  Insight:   Good  Judgment:  Good  Impulse Control: Good   Risk Assessment: Danger to Self:  No Self-injurious Behavior: No Danger to Others: No Duty to Warn:no Physical Aggression / Violence:No  Access to Firearms a concern: No  Gang Involvement:No   Subjective: Pt shares that "we got [name] off to the treatment facility in [place].  We learned, after the fact, that insurance would not pay for inpatient treatment so we are having to pay for that on our own.  They should pick up some of the outpatient piece when we get there."  Pt shares that they did have a chance to talk with [name]'s therapist yesterday and she agreed with pt and [name] that she does not see him as being bipolar.  She told them that he is doing well while being there.  Pt is happy that he is making use of his time there and that it will be beneficial for him.  Pt shares that they set [name] up with a small checking account with $25.00 in it.  [name] was looking at the account last night and he had overdrawn the account by $80.00 and had purchased porn  and energy drinks.  Pt is really frustrated by [name]'s continuing to make poor choices; [name] did make his therapist aware of the choices.  Pt shares that [name] doing this "was a gut punch for me."  Pt also shares that he has set up an appt with [name], NP at [facility] on 9/18.  Pt shares he did not sleep well last night because of the anger at [facility] for being dishonest this most recent time.  "I am exhausted, frustrated, sad, and mad."  Pt shares that they went to help [name]'s parents last weekend and that was work for both of them.  They are going to see [name] this weekend; her husband is travelling with his job right now and will be gone for a period of 3 wks on this trip.  Pt continues to walk the dog regularly and enjoys that.  Encouraged pt to continue to engage with his self care activities as intentionally as he can and we will meet in 3 wks for a follow up session.  Interventions: Cognitive Behavioral Therapy  Diagnosis:Adjustment disorder with mixed anxiety and depressed mood  Plan: Treatment Plan Strengths/Abilities:  Intelligent, Intuitive, Willing to participate in therapy Treatment Preferences:  Outpatient Individual Therapy Statement of Needs:  Patient is to use CBT, mindfulness and coping skills to help manage and/or decrease symptoms associated with their diagnosis. Symptoms:  Depressed/Irritable mood,  worry, social withdrawal Problems Addressed:  Depressive thoughts, Sadness, Sleep issues, etc. Long Term Goals:  Pt to reduce overall level, frequency, and intensity of the feelings of depression/anxiety as evidenced by decreased irritability, negative self talk, and helpless feelings from 6 to 7 days/week to 0 to 1 days/week, per client report, for at least 3 consecutive months.  Progress: 20% Short Term Goals:  Pt to verbally express understanding of the relationship between feelings of depression/anxiety and their impact on thinking patterns and behaviors.  Pt to verbalize an  understanding of the role that distorted thinking plays in creating fears, excessive worry, and ruminations.  Progress: 20% Target Date:  07/29/2023 Frequency:  Bi-weekly Modality:  Cognitive Behavioral Therapy Interventions by Therapist:  Therapist will use CBT, Mindfulness exercises, Coping skills and Referrals, as needed by client. Client has verbally approved this treatment plan.  [name], [facility]

## 2023-01-14 ENCOUNTER — Ambulatory Visit (INDEPENDENT_AMBULATORY_CARE_PROVIDER_SITE_OTHER): Payer: BC Managed Care – PPO | Admitting: Behavioral Health

## 2023-01-14 ENCOUNTER — Encounter: Payer: Self-pay | Admitting: Behavioral Health

## 2023-01-14 VITALS — BP 114/78 | HR 76 | Ht 70.0 in | Wt 172.0 lb

## 2023-01-14 DIAGNOSIS — F5105 Insomnia due to other mental disorder: Secondary | ICD-10-CM

## 2023-01-14 DIAGNOSIS — F331 Major depressive disorder, recurrent, moderate: Secondary | ICD-10-CM

## 2023-01-14 DIAGNOSIS — F411 Generalized anxiety disorder: Secondary | ICD-10-CM | POA: Diagnosis not present

## 2023-01-14 DIAGNOSIS — Z638 Other specified problems related to primary support group: Secondary | ICD-10-CM

## 2023-01-14 DIAGNOSIS — F99 Mental disorder, not otherwise specified: Secondary | ICD-10-CM

## 2023-01-14 MED ORDER — TRAZODONE HCL 50 MG PO TABS: 50.0000 mg | ORAL_TABLET | Freq: Every day | ORAL | 1 refills | Status: DC

## 2023-01-14 MED ORDER — ALPRAZOLAM 0.25 MG PO TABS: 0.2500 mg | ORAL_TABLET | Freq: Every evening | ORAL | 0 refills | Status: DC | PRN

## 2023-01-14 MED ORDER — VILAZODONE HCL 20 MG PO TABS: 20.0000 mg | ORAL_TABLET | Freq: Every day | ORAL | 1 refills | Status: DC

## 2023-01-14 NOTE — Progress Notes (Deleted)
[facility]  Patient ID: TRIPP BELTRAM,  MRN: [id number]  PCP: Lauro Regulus, MD  Date of Evaluation: 01/14/2023 Time spent:{TIME; 0 MIN TO 60 MIN:[phone]}  Chief Complaint:   HISTORY/CURRENT STATUS: HPI  Individual Medical History/ Review of Systems: Changes? :{EXAM; YES/NO:21197}  Allergies: Doxycycline, Chlorhexidine, and Chlorhexidine gluconate  Current Medications:  Current Outpatient Medications:    albuterol (VENTOLIN HFA) 108 (90 Base) MCG/ACT inhaler, Inhale into the lungs., Disp: , Rfl:    Azelastine HCl 137 MCG/SPRAY SOLN, , Disp: , Rfl:    azithromycin (ZITHROMAX Z-PAK) 250 MG tablet, Take 2 tablets (500 mg) today, then 1 tablet (250 mg) for next 4 days., Disp: 6 tablet, Rfl: 0   brompheniramine-pseudoephedrine-DM (BROMFED DM) 30-2-10 MG/5ML syrup, Bromfed DM Take 10 mL (oral) every 6 hours PRN - Cough [id number] syrup every 6 hours oral No set duration recorded days active 2-30-10 mg/5 mL, Disp: , Rfl:    buPROPion (WELLBUTRIN XL) 300 MG 24 hr tablet, Take by mouth., Disp: , Rfl:    celecoxib (CELEBREX) 100 MG capsule, Take 100 mg by mouth 2 (two) times daily., Disp: , Rfl:    escitalopram (LEXAPRO) 10 MG tablet, , Disp: , Rfl:    fluticasone (FLONASE) 50 MCG/ACT nasal spray, Place 2 sprays into both nostrils daily., Disp: , Rfl:    HYDROcodone-acetaminophen (NORCO/VICODIN) 5-325 MG tablet, Take by mouth., Disp: , Rfl:    meloxicam (MOBIC) 15 MG tablet, Take 1 tablet every day by oral route., Disp: , Rfl:    mometasone (NASONEX) 50 MCG/ACT nasal spray, Place 2 sprays into the nose daily., Disp: , Rfl:    mupirocin ointment (BACTROBAN) 2 %, Apply topically 2 (two) times daily., Disp: , Rfl:    oseltamivir (TAMIFLU) 75 MG capsule, Tamiflu Take 1 capsule (oral) 2 times per day for 5 days [id number] capsule 2 times per day oral 5 days active 75 MG, Disp: , Rfl:    predniSONE (STERAPRED UNI-PAK 21 TAB) 10 MG (21) TBPK tablet, Take by mouth daily. Take  6 tabs by mouth daily for 1 day, then 5 tabs for 1 day, then 4 tabs for 1 day, then 3 tabs for 1 day, then 2 tabs for 1 day, then 1 tab by mouth daily for 1 day, Disp: 21 tablet, Rfl: 0   Ruxolitinib Phosphate (OPZELURA) 1.5 % CREA, , Disp: , Rfl:    traZODone (DESYREL) 50 MG tablet, Take 50-100 mg by mouth at bedtime., Disp: , Rfl:    zolpidem (AMBIEN) 10 MG tablet, zolpidem 10 mg tablet, Disp: , Rfl:  Medication Side Effects: {Medication Side Effects (Optional):12147}  Family Medical/ Social History: Changes? {EXAM; YES/NO:19492}  MENTAL HEALTH EXAM:  Blood pressure 114/78, pulse 76, height 5\' 10"  (1.778 m), weight 172 lb (78 kg).Body mass index is 24.68 kg/m.  General Appearance: {PSY:[phone]}  Eye Contact:  {PSY:22684}  Speech:  {PSY:[phone]}  Volume:  {PSY:22686}  Mood:  {PSY:22306}  Affect:  {PSY:[phone]}  Thought Process:  {PSY:22688}  Orientation:  {PSY:22689}  Thought Content: {PSYt:22690}   Suicidal Thoughts:  {PSY:22692}  Homicidal Thoughts:  {PSY:22692}  Memory:  {[phone]}  Judgement:  {PSY:22694}  Insight:  {PSY:22695}  Psychomotor Activity:  {PSY:22696}  Concentration:  {PSY:21399}  Recall:  {PSY:22877}  Fund of Knowledge: {PSY:22877}  Language: {MWN:02725}  Assets:  {PSY:22698}  ADL's:  {PSY:22290}  Cognition: {PSY:304700322}  Prognosis:  {PSY:22877}    DIAGNOSES: No diagnosis found.  Receiving Psychotherapy: {DGU:44034}   RECOMMENDATIONS: ***   Joan Flores,  NP

## 2023-01-14 NOTE — Progress Notes (Signed)
[facility] MD/PA/NP Initial Note  01/14/2023 1:03 PM Alexander Turner  MRN:  [id number]  Chief Complaint:  Chief Complaint   Depression; Anxiety; Stress; Family Problem; Establish Care; Patient Education     HPI:   "Alexander Turner", 57 year old male reports to this office for initial visit and to establish care.  Collateral information should be considered reliable.  Patient is tearful at times and reviewing stressors with family.  Says that his son is currently in residential treatment for mental health issues that have been occurring since early childhood.  Says that his wife had a previous episode with cancer.  His daughter recently got married and he had some concerns.  Says that he has felt very overwhelmed with extreme anxiety at times and reports worsening depression.  Says that he has tried a couple of antidepressants in the past but did not feel that they were effective.  He continues in psychotherapy with Maggie Font.  Says that he is here today to discuss medication to see if he can find something that might be effective in helping.  Patient reports his depression today at 6/10 and anxiety at 5/10. PHQ-9 score of 10. MDQ was negative.  Patient states that he currently feels safe.  Verbally contracted for safety with this Clinical research associate.  Reports no history of mania, no psychosis, no auditory or visual hallucinations.  No SI or HI.  Past psychiatric medication trials: Wellbutrin-Disturbing thoughts Lexapro-Report non effective, sexual side effects BuSpar-non effective Xanax   Visit Diagnosis:    ICD-10-CM   1. Moderate episode of recurrent major depressive disorder (HCC)  F33.1     2. Generalized anxiety disorder  F41.1 ALPRAZolam (XANAX) 0.25 MG tablet    3. Caregiver role strain  Z63.8     4. Insomnia due to other mental disorder  F51.05 Vilazodone HCl 20 MG TABS   F99 traZODone (DESYREL) 50 MG tablet      Past Psychiatric History: Anxiety, Adjustment Disorder  Past Medical History:   Past Medical History:  Diagnosis Date   Multiple lipomas    bilateral upper and lower extremities    Past Surgical History:  Procedure Laterality Date   COLONOSCOPY     KNEE SURGERY Right    LIPOMA EXCISION Right 02/08/2019   RUE x 3 lipomas   LIPOMA EXCISION Left 03/18/2019   LUE x 1 lipoma   NASAL ENDOSCOPY     TONSILLECTOMY      Family Psychiatric History: see chart  Family History:  Family History  Problem Relation Age of Onset   Anxiety disorder Mother    Depression Mother    Anxiety disorder Father    Depression Father     Social History:  Social History   Socioeconomic History   Marital status: Married    Spouse name: [name]   Number of children: 2   Years of education: 16   Highest education level: Bachelor's degree (e.g., BA, AB, BS)  Occupational History   Occupation: Counsellor  Tobacco Use   Smoking status: Never   Smokeless tobacco: Never  Substance and Sexual Activity   Alcohol use: Yes    Comment: light social   Drug use: Never   Sexual activity: Yes  Other Topics Concern   Not on file  Social History Narrative   Lives in [place] with wife and son. Son is currently in group home near [place].  Enjoys golfing, walking dog,    In free time.    Social Determinants of  Health   Financial Resource Strain: Not on file  Food Insecurity: Not on file  Transportation Needs: Not on file  Physical Activity: Not on file  Stress: Not on file  Social Connections: Not on file    Allergies:  Allergies  Allergen Reactions   Doxycycline    Chlorhexidine Rash   Chlorhexidine Gluconate Rash    Metabolic Disorder Labs: No results found for: "HGBA1C", "MPG" No results found for: "PROLACTIN" No results found for: "CHOL", "TRIG", "HDL", "CHOLHDL", "VLDL", "LDLCALC" No results found for: "TSH"  Therapeutic Level Labs: No results found for: "LITHIUM" No results found for: "VALPROATE" No results found for: "CBMZ"  Current  Medications: Current Outpatient Medications  Medication Sig Dispense Refill   ALPRAZolam (XANAX) 0.25 MG tablet Take 1 tablet (0.25 mg total) by mouth at bedtime as needed for anxiety. 20 tablet 0   fluticasone (FLONASE) 50 MCG/ACT nasal spray Place 2 sprays into both nostrils daily.     Vilazodone HCl 20 MG TABS Take 1 tablet (20 mg total) by mouth daily after breakfast. 30 tablet 1   zolpidem (AMBIEN) 10 MG tablet zolpidem 10 mg tablet     albuterol (VENTOLIN HFA) 108 (90 Base) MCG/ACT inhaler Inhale into the lungs. (Patient not taking: Reported on 01/14/2023)     Azelastine HCl 137 MCG/SPRAY SOLN  (Patient not taking: Reported on 01/14/2023)     azithromycin (ZITHROMAX Z-PAK) 250 MG tablet Take 2 tablets (500 mg) today, then 1 tablet (250 mg) for next 4 days. (Patient not taking: Reported on 01/14/2023) 6 tablet 0   brompheniramine-pseudoephedrine-DM (BROMFED DM) 30-2-10 MG/5ML syrup Bromfed DM Take 10 mL (oral) every 6 hours PRN - Cough [id number] syrup every 6 hours oral No set duration recorded days active 2-30-10 mg/5 mL     buPROPion (WELLBUTRIN XL) 300 MG 24 hr tablet Take by mouth. (Patient not taking: Reported on 01/14/2023)     celecoxib (CELEBREX) 100 MG capsule Take 100 mg by mouth 2 (two) times daily. (Patient not taking: Reported on 01/14/2023)     escitalopram (LEXAPRO) 10 MG tablet  (Patient not taking: Reported on 01/14/2023)     HYDROcodone-acetaminophen (NORCO/VICODIN) 5-325 MG tablet Take by mouth. (Patient not taking: Reported on 01/14/2023)     meloxicam (MOBIC) 15 MG tablet Take 1 tablet every day by oral route. (Patient not taking: Reported on 01/14/2023)     mometasone (NASONEX) 50 MCG/ACT nasal spray Place 2 sprays into the nose daily. (Patient not taking: Reported on 01/14/2023)     mupirocin ointment (BACTROBAN) 2 % Apply topically 2 (two) times daily. (Patient not taking: Reported on 01/14/2023)     oseltamivir (TAMIFLU) 75 MG capsule Tamiflu Take 1 capsule (oral) 2 times per day  for 5 days [id number] capsule 2 times per day oral 5 days active 75 MG (Patient not taking: Reported on 01/14/2023)     predniSONE (STERAPRED UNI-PAK 21 TAB) 10 MG (21) TBPK tablet Take by mouth daily. Take 6 tabs by mouth daily for 1 day, then 5 tabs for 1 day, then 4 tabs for 1 day, then 3 tabs for 1 day, then 2 tabs for 1 day, then 1 tab by mouth daily for 1 day (Patient not taking: Reported on 01/14/2023) 21 tablet 0   Ruxolitinib Phosphate (OPZELURA) 1.5 % CREA  (Patient not taking: Reported on 01/14/2023)     traZODone (DESYREL) 50 MG tablet Take 1-2 tablets (50-100 mg total) by mouth at bedtime. 180 tablet 1   No current facility-administered  medications for this visit.    Medication Side Effects: none  Orders placed this visit:  No orders of the defined types were placed in this encounter.   Psychiatric Specialty Exam:  Review of Systems  Constitutional: Negative.   Allergic/Immunologic: Negative.   Psychiatric/Behavioral:  Positive for dysphoric mood. The patient is nervous/anxious.     Blood pressure 114/78, pulse 76, height 5\' 10"  (1.778 m), weight 172 lb (78 kg).Body mass index is 24.68 kg/m.  General Appearance: Casual, Neat, and Well Groomed  Eye Contact:  Good  Speech:  Clear and Coherent  Volume:  Normal  Mood:  Anxious, Depressed, and Dysphoric  Affect:  Depressed, Tearful, and Anxious  Thought Process:  Coherent  Orientation:  Full (Time, Place, and Person)  Thought Content: Logical   Suicidal Thoughts:  No  Homicidal Thoughts:  No  Memory:  WNL  Judgement:  Good  Insight:  Good  Psychomotor Activity:  Normal  Concentration:  Concentration: Good  Recall:  Good  Fund of Knowledge: Good  Language: Good  Assets:  Desire for Improvement  ADL's:  Intact  Cognition: WNL  Prognosis:  Good   Screenings:  PHQ2-9    Flowsheet Row Office Visit from 01/14/2023 in [facility]  PHQ-2 Total Score 2  PHQ-9 Total Score 10      Flowsheet  Row ED from 02/21/2022 in [facility]   C-SSRS RISK CATEGORY No Risk       Receiving Psychotherapy: No   Treatment Plan/Recommendations:  Greater than 50% of 60 min  face to face time with patient was spent on counseling and coordination of care. We discussed exacerbation of depression, anxiety, and in stress secondary to caregiver role strain.  We talked about his anxiety over his son's mental health, and being the source of support and care for his wife and daughter.  We discussed his previous plan of care with his PCP and reviewed his psychiatric medication trials.  It was recommended that he continue in psychotherapy.  We agreed today to:  Will start Viibryd 10 mg for 7 days then 20 mg daily.  Must take with food. To continue trazodone 50 mg at bedtime for sleep To continue, #20, Xanax 0.25 mg daily only for severe anxiety Will report worsening symptoms or side effects promptly Will follow-up in 4 weeks to reassess Provided emergency contact information number Discussed potential benefits, risk, and side effects of benzodiazepines to include potential risk of tolerance and dependence, as well as possible drowsiness.  Advised patient not to drive if experiencing drowsiness and to take lowest possible effective dose to minimize risk of dependence and tolerance.  Reviewed PDMP         Joan Flores, NP

## 2023-01-15 ENCOUNTER — Ambulatory Visit (INDEPENDENT_AMBULATORY_CARE_PROVIDER_SITE_OTHER): Payer: BC Managed Care – PPO | Admitting: Psychology

## 2023-01-15 DIAGNOSIS — F4323 Adjustment disorder with mixed anxiety and depressed mood: Secondary | ICD-10-CM

## 2023-01-15 NOTE — Progress Notes (Signed)
[facility] Counselor/Therapist Progress Note  Patient ID: Alexander Turner, MRN: [id number],    Date: 01/15/2023  Time Spent: 60 mins; start time: 1300; stop time: 1400  Treatment Type: Individual Therapy  Reported Symptoms: Pt presents for the session via Caregility video  He states that he is in his home; he grants consent for the session and states that he understands the limits of virtual sessions.  I shared with pt that I am in my office with no one else present here.    Mental Status Exam: Appearance:  Casual     Behavior: Appropriate  Motor: Normal  Speech/Language:  Clear and Coherent  Affect: Appropriate  Mood: normal  Thought process: normal  Thought content:   WNL  Sensory/Perceptual disturbances:   WNL  Orientation: oriented to person, place, and time/date  Attention: Good  Concentration: Good  Memory: WNL  Fund of knowledge:  Good  Insight:   Good  Judgment:  Good  Impulse Control: Good   Risk Assessment: Danger to Self:  No Self-injurious Behavior: No Danger to Others: No Duty to Warn:no Physical Aggression / Violence:No  Access to Firearms a concern: No  Gang Involvement:No   Subjective: Pt shares that "a lot is going on.  Pt shares he met with Alexander Laine, NP yesterday and I am going to be starting on Vybrid and will see him again in 4 wks for a follow visit.  We got the results of the psychological evaluation they have done and they are going over the results with Alexander Turner today.  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  Alexander Turner is now in the residential part of the Alexander Turner program; pt is not sure how long he will be there.  Pt shares that he feels a significant amount of responsibility for everyone around him (i.e., Alexander Turner, Alexander Turner, his mom, Alexander Turner, work, Alexander Turner.).  Encouraged pt to be as intentional as possible about  engaging in self care activities.  Also asked pt to choose one thing each morning that he can be thankful for in his life and to focus on that thing throughout each day.  Encouraged pt to continue to engage with his self care activities as intentionally as he can and we will meet in 2 wks for a follow up session.  Interventions: Cognitive Behavioral Therapy  Diagnosis:Adjustment disorder with mixed anxiety and depressed mood  Plan: Treatment Plan Strengths/Abilities:  Intelligent, Intuitive, Willing to participate in therapy Treatment Preferences:  Outpatient Individual Therapy Statement of Needs:  Patient is to use CBT, mindfulness and coping skills to help manage and/or decrease symptoms associated with their diagnosis. Symptoms:  Depressed/Irritable mood, worry, social withdrawal Problems Addressed:  Depressive thoughts, Sadness, Sleep issues, etc. Long Term Goals:  Pt to reduce overall level, frequency, and intensity of the feelings of depression/anxiety as evidenced by decreased irritability, negative self talk, and helpless feelings from 6 to 7 days/week to 0 to 1 days/week, per client report, for at least 3 consecutive months.  Progress: 20% Short Term Goals:  Pt to verbally express understanding of the relationship between feelings of depression/anxiety and their impact on thinking patterns and behaviors.  Pt to verbalize an understanding of the role that distorted thinking plays in creating fears, excessive worry, and ruminations.  Progress: 20% Target Date:  07/29/2023 Frequency:  Bi-weekly Modality:  Cognitive Behavioral Therapy Interventions by Therapist:  Therapist will use CBT, Mindfulness exercises, Coping skills and Referrals, as needed by client. Client has verbally approved this treatment plan.  Alexander Turner, [facility]

## 2023-01-28 ENCOUNTER — Other Ambulatory Visit: Payer: Self-pay | Admitting: Behavioral Health

## 2023-01-28 DIAGNOSIS — F411 Generalized anxiety disorder: Secondary | ICD-10-CM

## 2023-01-29 ENCOUNTER — Telehealth: Payer: Self-pay | Admitting: Behavioral Health

## 2023-01-29 ENCOUNTER — Ambulatory Visit: Payer: BC Managed Care – PPO | Admitting: Psychology

## 2023-01-29 DIAGNOSIS — F4323 Adjustment disorder with mixed anxiety and depressed mood: Secondary | ICD-10-CM

## 2023-01-29 NOTE — Telephone Encounter (Signed)
Patient is aware he needs to contact PCP for Ambien. He said pharmacy reporting he has no RF on trazodone. Will call.

## 2023-01-29 NOTE — Progress Notes (Signed)
[facility] Counselor/Therapist Progress Note  Patient ID: Alexander Turner, MRN: [id number],    Date: 01/29/2023  Time Spent: 50 mins; start time: 1300; stop time: 1350  Treatment Type: Individual Therapy  Reported Symptoms: Pt presents for the session via Caregility video  He states that he is in his home; he grants consent for the session and states that he understands the limits of virtual sessions.  I shared with pt that I am in my office with no one else present here.    Mental Status Exam: Appearance:  Casual     Behavior: Appropriate  Motor: Normal  Speech/Language:  Clear and Coherent  Affect: Appropriate  Mood: normal  Thought process: normal  Thought content:   WNL  Sensory/Perceptual disturbances:   WNL  Orientation: oriented to person, place, and time/date  Attention: Good  Concentration: Good  Memory: WNL  Fund of knowledge:  Good  Insight:   Good  Judgment:  Good  Impulse Control: Good   Risk Assessment: Danger to Self:  No Self-injurious Behavior: No Danger to Others: No Duty to Warn:no Physical Aggression / Violence:No  Access to Firearms a concern: No  Gang Involvement:No   Subjective: Pt shares that "I had a good weekend last weekend.  I went with 12 guys out to [facility] for my birthday.  We played golf, had a party, and went to the [employer] game out there on Sunday.  [name] was able to get out of [place] on Sunday and got back here safely after a 5 hour drive home.  It is an adjustment to having him back home.  He is touching base with his therapist today to get an update.  One other thing, [name]'s father's lung cancer has returned and spread and he has said he is not willing to go through treatment again.  They have a mtg with the oncologist tomorrow to establish a plan of care."  Pt shares [name] is still living and they are worried about how that will effect her and them.  Pt is continuing to take his antidepressant daily and feels like  he is tolerating it very well so far.  Pt is trying to keep everything in perspective but it is a struggle.  Work is also stressful but pt knows he has to stay with it because he has to provide for the family and insurance.  Talked with pt about how it has been with [name] in the home; they have had some good conversations and pt is pleased with that.  Pt shares that he has been working on the exercise of choosing one thing every day to be thankful for and he has found that to be meaningful for him, especially at this time of everything swirling around him so much.  Encouraged pt to continue to engage with his self care activities as intentionally as he can and we will meet in 3 wks for a follow up session.  Interventions: Cognitive Behavioral Therapy  Diagnosis:Adjustment disorder with mixed anxiety and depressed mood  Plan: Treatment Plan Strengths/Abilities:  Intelligent, Intuitive, Willing to participate in therapy Treatment Preferences:  Outpatient Individual Therapy Statement of Needs:  Patient is to use CBT, mindfulness and coping skills to help manage and/or decrease symptoms associated with their diagnosis. Symptoms:  Depressed/Irritable mood, worry, social withdrawal Problems Addressed:  Depressive thoughts, Sadness, Sleep issues, etc. Long Term Goals:  Pt to reduce overall level, frequency, and intensity of the feelings of depression/anxiety as evidenced by  decreased irritability, negative self talk, and helpless feelings from 6 to 7 days/week to 0 to 1 days/week, per client report, for at least 3 consecutive months.  Progress: 20% Short Term Goals:  Pt to verbally express understanding of the relationship between feelings of depression/anxiety and their impact on thinking patterns and behaviors.  Pt to verbalize an understanding of the role that distorted thinking plays in creating fears, excessive worry, and ruminations.  Progress: 20% Target Date:  07/29/2023 Frequency:   Bi-weekly Modality:  Cognitive Behavioral Therapy Interventions by Therapist:  Therapist will use CBT, Mindfulness exercises, Coping skills and Referrals, as needed by client. Client has verbally approved this treatment plan.  [name], [facility]

## 2023-01-29 NOTE — Telephone Encounter (Signed)
Next visit is 02/11/23. Alexander Turner was on vacation and didn't get his Trazodone and Ambien out of the nightside drawer. He followed up with the front desk and they didn't have it. Can he have a refill on both Ambien and Trazodone called to:  [facility] - [place], [place] - [id number]   Phone: [phone]  Fax: [phone]

## 2023-01-29 NOTE — Telephone Encounter (Signed)
[facility] filled 90-day supply on 9/18.  They do have a RF available. Notified patient.

## 2023-01-29 NOTE — Telephone Encounter (Signed)
Patient has a RF available on trazodone. We do not prescribe his Ambien.

## 2023-02-11 ENCOUNTER — Ambulatory Visit: Payer: BC Managed Care – PPO | Admitting: Behavioral Health

## 2023-02-11 ENCOUNTER — Encounter: Payer: Self-pay | Admitting: Behavioral Health

## 2023-02-11 DIAGNOSIS — F99 Mental disorder, not otherwise specified: Secondary | ICD-10-CM

## 2023-02-11 DIAGNOSIS — F5105 Insomnia due to other mental disorder: Secondary | ICD-10-CM

## 2023-02-11 MED ORDER — VILAZODONE HCL 20 MG PO TABS: 20.0000 mg | ORAL_TABLET | Freq: Every day | ORAL | 1 refills | Status: DC
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
## 2023-02-19 ENCOUNTER — Ambulatory Visit: Payer: BC Managed Care – PPO | Admitting: Psychology

## 2023-02-19 DIAGNOSIS — F4323 Adjustment disorder with mixed anxiety and depressed mood: Secondary | ICD-10-CM

## 2023-02-19 NOTE — Progress Notes (Signed)
[name]  Patient ID: Alexander Turner, MRN: [id number],    Date: 02/19/2023  Time Spent: 50 mins; start time: 1600; stop time: 1650  Treatment Type: Individual Therapy  Reported Symptoms: Pt presents for the session via Caregility video  He states that he is in his home; he grants consent for the session and states that he understands the limits of virtual sessions.  I shared with pt that I am in my office with no one else present here.    Mental Status Exam: Appearance:  Casual     Behavior: Appropriate  Motor: Normal  Speech/Language:  Clear and Coherent  Affect: Appropriate  Mood: normal  Thought process: normal  Thought content:   WNL  Sensory/Perceptual disturbances:   WNL  Orientation: oriented to person, place, and time/date  Attention: Good  Concentration: Good  Memory: WNL  Fund of knowledge:  Good  Insight:   Good  Judgment:  Good  Impulse Control: Good   Risk Assessment: Danger to Self:  No Self-injurious Behavior: No Danger to Others: No Duty to Warn:no Physical Aggression / Violence:No  Access to Firearms a concern: No  Gang Involvement:No   Subjective: Pt shares that "I had a follow up visit with Alexander Laine, NP this past week to talk about the medication change.  He is listening to how I am feeling and I feel good about it.  I let him know I am still dealing with life and the stuff that comes with it."  Pt shares that Alexander Turner's mom has fallen and banged herself up a bit; her dad has started to get a targeted treatment for his cancer that will hopefully give him a bit more time to live.  Pt talked with Alexander Turner in a very supportive way and encouraged her to see her PCP and consider getting on medication; she saw her PCP on Monday and started Lexapro; pt is hopeful for her.  Pt shares that Alexander Turner is seeing his therapist from [place] virtually twice a week; he has an appt with an ASD therapist set up for November  as well.  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  Pt continues to walk his dog regularly and enjoys that time.  Work continues to be stressful but pt knows he has to stay with it because he has to provide for the family and insurance.  His boss is currently unhappy with one of pt's employees and he is having to balance her expectations for the employee and pt's relationship with his boss.  Pt continues to talk with Alexander Turner as casually as possible; pt played pickle ball last night at the club and enjoyed that time.  Pt continues to work on the exercise of choosing one thing every day to be thankful for and he has found that to be meaningful for him.  Encouraged pt to continue to engage with his self care activities as intentionally as he can and we will meet in 4 wks for a follow up session.  Interventions: Cognitive Behavioral Therapy  Diagnosis:Adjustment disorder with mixed anxiety and depressed mood  Plan: Treatment Plan Strengths/Abilities:  Intelligent, Intuitive, Willing to participate in therapy Treatment Preferences:  Outpatient Individual Therapy Statement of Needs:  Patient is to use CBT, mindfulness and coping skills to help manage and/or decrease symptoms associated with their diagnosis. Symptoms:  Depressed/Irritable mood, worry, social withdrawal Problems Addressed:  Depressive thoughts, Sadness, Sleep issues, etc. Long Term Goals:  Pt to reduce overall level, frequency, and intensity of the feelings of depression/anxiety as evidenced by decreased irritability, negative self talk, and helpless feelings from 6 to 7 days/week to 0 to 1 days/week, per client report, for at least 3 consecutive months.  Progress: 20% Short Term Goals:  Pt to verbally express understanding of the relationship between feelings of depression/anxiety and their impact on thinking patterns and behaviors.  Pt to  verbalize an understanding of the role that distorted thinking plays in creating fears, excessive worry, and ruminations.  Progress: 20% Target Date:  07/29/2023 Frequency:  Bi-weekly Modality:  Cognitive Behavioral Therapy Interventions by Therapist:  Therapist will use CBT, Mindfulness exercises, Coping skills and Referrals, as needed by client. Client has verbally approved this treatment plan.  Alexander Turner, [facility]

## 2023-02-25 ENCOUNTER — Telehealth: Payer: Self-pay | Admitting: Behavioral Health

## 2023-02-25 MED ORDER — VILAZODONE HCL 40 MG PO TABS: 40.0000 mg | ORAL_TABLET | Freq: Every day | ORAL | 0 refills | Status: DC

## 2023-02-25 NOTE — Telephone Encounter (Signed)
PT is calling back to f/up. He is requesting an increase in his dose of Vilazodone/Viibryd. He is currently taking 20mg . Call back.

## 2023-02-25 NOTE — Telephone Encounter (Signed)
Rx sent 

## 2023-02-25 NOTE — Telephone Encounter (Signed)
Patient is reporting some improvement with Viibryd 20 mg. He said he is 40-50% improved. He wants to feel even better and said you told him he could bump it up if needed. I can send in Rx, just let me know if he would go to 40 or a different dose.

## 2023-03-18 ENCOUNTER — Ambulatory Visit: Payer: BC Managed Care – PPO | Admitting: Psychology

## 2023-03-18 DIAGNOSIS — F4323 Adjustment disorder with mixed anxiety and depressed mood: Secondary | ICD-10-CM
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
## 2023-04-03 ENCOUNTER — Emergency Department: Payer: BC Managed Care – PPO

## 2023-04-03 ENCOUNTER — Emergency Department
Admission: EM | Admit: 2023-04-03 | Discharge: 2023-04-03 | Disposition: A | Discharge status: Home / Self Care | Payer: BC Managed Care – PPO | Attending: Emergency Medicine | Admitting: Emergency Medicine

## 2023-04-03 DIAGNOSIS — R002 Palpitations: Secondary | ICD-10-CM

## 2023-04-03 DIAGNOSIS — R000 Tachycardia, unspecified: Secondary | ICD-10-CM | POA: Diagnosis not present

## 2023-04-03 LAB — BASIC METABOLIC PANEL
Anion gap: 10 (ref 5–15)
BUN: 18 mg/dL (ref 6–20)
CO2: 24 mmol/L (ref 22–32)
Calcium: 9 mg/dL (ref 8.9–10.3)
Chloride: 106 mmol/L (ref 98–111)
Creatinine, Ser: 1.22 mg/dL (ref 0.61–1.24)
GFR, Estimated: >60 mL/min (ref >60)
Glucose, Bld: 122 mg/dL — ABNORMAL HIGH (ref 70–99)
Potassium: 3.6 mmol/L (ref 3.5–5.1)
Sodium: 140 mmol/L (ref 135–145)

## 2023-04-03 LAB — TROPONIN I (HIGH SENSITIVITY)
Troponin I (High Sensitivity): 2 ng/L (ref <18)
Troponin I (High Sensitivity): 3 ng/L (ref <18)

## 2023-04-03 LAB — CBC
HCT: 41.3 % (ref 39.0–52.0)
Hemoglobin: 13.8 g/dL (ref 13.0–17.0)
MCH: 29.9 pg (ref 26.0–34.0)
MCHC: 33.4 g/dL (ref 30.0–36.0)
MCV: 89.6 fL (ref 80.0–100.0)
Platelets: 278 10*3/uL (ref 150–400)
RBC: 4.61 MIL/uL (ref 4.22–5.81)
RDW: 12.2 % (ref 11.5–15.5)
WBC: 7.1 10*3/uL (ref 4.0–10.5)
nRBC: 0 % (ref 0.0–0.2)

## 2023-04-03 NOTE — ED Notes (Signed)
Pt reports feeling palpitations started yesterday, reports checking pulse and it was greater than 100. Denies chest pain, shob, n/v. Reports starting new medication for depression and reading side effects and concerned for serotonin syndrome.  Alert and oriented. NAD noted. Call bell in reach. Declines blanket at this time.

## 2023-04-03 NOTE — Discharge Instructions (Signed)
Please seek medical attention for any high fevers, chest pain, shortness of breath, change in behavior, persistent vomiting, bloody stool or any other new or concerning symptoms.  

## 2023-04-03 NOTE — ED Provider Notes (Signed)
[facility] Provider Note    Event Date/Time   First MD Initiated Contact with Patient 04/03/23 1717     (approximate)   History   Palpitations   HPI  Alexander Turner is a 57 y.o. male presents to the emergency department today because of concerns for tachycardia and palpitations.  Patient states he first noticed it yesterday.  Had not done any unusual activity.  He again felt that same sensation today.  He contacted his doctor who was worried for potential serotonin syndrome given that patient is on an antidepressant.     Physical Exam   Triage Vital Signs: ED Triage Vitals [04/03/23 1242]  Encounter Vitals Group     BP 135/77     Systolic BP Percentile      Diastolic BP Percentile      Pulse Rate 83     Resp 20     Temp 98 F (36.7 C)     Temp Source Oral     SpO2 98 %     Weight      Height      Head Circumference      Peak Flow      Pain Score      Pain Loc      Pain Education      Exclude from Growth Chart     Most recent vital signs: Vitals:   04/03/23 1629 04/03/23 1655  BP: (!) 149/84   Pulse: 81   Resp: 18   Temp:  98 F (36.7 C)  SpO2: 98%    General: Awake, alert, oriented. CV:  Good peripheral perfusion. Regular rate and rhythm. Resp:  Normal effort. Lungs clear. Abd:  No distention.    ED Results / Procedures / Treatments   Labs (all labs ordered are listed, but only abnormal results are displayed) Labs Reviewed  BASIC METABOLIC PANEL - Abnormal; Notable for the following components:      Result Value   Glucose, Bld 122 (*)    All other components within normal limits  CBC  TROPONIN I (HIGH SENSITIVITY)  TROPONIN I (HIGH SENSITIVITY)     EKG  I, Phineas Semen, attending physician, personally viewed and interpreted this EKG  EKG Time: 1303 Rate: 83 Rhythm: normal sinus rhythm Axis: normal Intervals: qtc 415 QRS: narrow, q waves v1, v2 ST changes: no st elevation Impression: abnormal  ekg   RADIOLOGY I independently interpreted and visualized the CXR. My interpretation: No pneumonia Radiology interpretation:  IMPRESSION:  No active cardiopulmonary disease.     PROCEDURES:  Critical Care performed: No    MEDICATIONS ORDERED IN ED: Medications - No data to display   IMPRESSION / MDM / ASSESSMENT AND PLAN / ED COURSE  I reviewed the triage vital signs and the nursing notes.                              Differential diagnosis includes, but is not limited to, arrhythmia, electrolyte abnormality, ACS, serotonin syndrome  Patient's presentation is most consistent with acute presentation with potential threat to life or bodily function.  Patient presented to the emergency department today because of concerns for tachycardia and palpitations.  At the time my exam patient states he is feeling better.  EKG here without concerning arrhythmia.  Blood work without concerning electrolyte abnormality or troponin elevation.  At this time unclear etiology of the tachycardia although patient is feeling  better.  I think is reasonable for patient to be discharged.     FINAL CLINICAL IMPRESSION(S) / ED DIAGNOSES   Final diagnoses:  Palpitations     Note:  This document was prepared using Dragon voice recognition software and may include unintentional dictation errors.    Phineas Semen, MD 04/03/23 2255

## 2023-04-03 NOTE — ED Triage Notes (Signed)
Pt to ED via POV from home. Pt reports palpitations that started yesterday and got worse this morning. Pt reports HR reading on device was 103 and oxygen was 98%. Pt reports intermittent SOB, night sweats and possible fever. Pt reports started a new medication called Viibryd one month ago and they have made some dose increases.

## 2023-04-04 ENCOUNTER — Other Ambulatory Visit: Payer: Self-pay | Admitting: Behavioral Health

## 2023-04-08 ENCOUNTER — Ambulatory Visit: Payer: BC Managed Care – PPO | Admitting: Behavioral Health

## 2023-04-10 ENCOUNTER — Ambulatory Visit: Payer: BC Managed Care – PPO | Admitting: Behavioral Health

## 2023-04-10 ENCOUNTER — Encounter: Payer: Self-pay | Admitting: Behavioral Health

## 2023-04-10 DIAGNOSIS — F99 Mental disorder, not otherwise specified: Secondary | ICD-10-CM | POA: Diagnosis not present

## 2023-04-10 DIAGNOSIS — F4323 Adjustment disorder with mixed anxiety and depressed mood: Secondary | ICD-10-CM

## 2023-04-10 DIAGNOSIS — F331 Major depressive disorder, recurrent, moderate: Secondary | ICD-10-CM

## 2023-04-10 DIAGNOSIS — F5105 Insomnia due to other mental disorder: Secondary | ICD-10-CM

## 2023-04-10 MED ORDER — TRAZODONE HCL 50 MG PO TABS: 50.0000 mg | ORAL_TABLET | Freq: Every day | ORAL | 1 refills | Status: DC

## 2023-04-10 MED ORDER — VILAZODONE HCL 40 MG PO TABS: 40.0000 mg | ORAL_TABLET | Freq: Every day | ORAL | 1 refills | Status: DC

## 2023-04-10 NOTE — Progress Notes (Signed)
[facility]  Patient ID: Alexander Turner,  MRN: [id number]  PCP: Lauro Regulus, MD  Date of Evaluation: 04/10/2023 Time spent:30 minutes  Chief Complaint:   HISTORY/CURRENT STATUS: HPI "Tee", 57 year old male reports to this office for follow up and medication management.  Collateral information should be considered reliable.  Says, "I'm feeling great and I thank you for helping me out". Says he has been able to cope more effectively and anxiety and depression are barely noticeable now. Patient reports his depression today at 1/10 and anxiety at 1/10.   Patient states that he currently feels safe.  Verbally contracted for safety with this Clinical research associate.  Reports no history of mania, no psychosis, no auditory or visual hallucinations.  No SI or HI.   Past psychiatric medication trials: Wellbutrin-Disturbing thoughts Lexapro-Report non effective, sexual side effects BuSpar-non effective Xanax      Individual Medical History/ Review of Systems: Changes? :No   Allergies: Doxycycline, Chlorhexidine, and Chlorhexidine gluconate  Current Medications:  Current Outpatient Medications:    albuterol (VENTOLIN HFA) 108 (90 Base) MCG/ACT inhaler, Inhale into the lungs. (Patient not taking: Reported on 01/14/2023), Disp: , Rfl:    ALPRAZolam (XANAX) 0.25 MG tablet, TAKE ONE TABLET AT BEDTIME AS NEEDED FORANXIETY, Disp: 20 tablet, Rfl: 0   Azelastine HCl 137 MCG/SPRAY SOLN, , Disp: , Rfl:    azithromycin (ZITHROMAX Z-PAK) 250 MG tablet, Take 2 tablets (500 mg) today, then 1 tablet (250 mg) for next 4 days. (Patient not taking: Reported on 01/14/2023), Disp: 6 tablet, Rfl: 0   brompheniramine-pseudoephedrine-DM (BROMFED DM) 30-2-10 MG/5ML syrup, Bromfed DM Take 10 mL (oral) every 6 hours PRN - Cough [id number] syrup every 6 hours oral No set duration recorded days active 2-30-10 mg/5 mL, Disp: , Rfl:    buPROPion (WELLBUTRIN XL) 300 MG 24 hr tablet, Take by mouth. (Patient not  taking: Reported on 01/14/2023), Disp: , Rfl:    celecoxib (CELEBREX) 100 MG capsule, Take 100 mg by mouth 2 (two) times daily. (Patient not taking: Reported on 01/14/2023), Disp: , Rfl:    escitalopram (LEXAPRO) 10 MG tablet, , Disp: , Rfl:    fluticasone (FLONASE) 50 MCG/ACT nasal spray, Place 2 sprays into both nostrils daily., Disp: , Rfl:    HYDROcodone-acetaminophen (NORCO/VICODIN) 5-325 MG tablet, Take by mouth. (Patient not taking: Reported on 01/14/2023), Disp: , Rfl:    meloxicam (MOBIC) 15 MG tablet, Take 1 tablet every day by oral route. (Patient not taking: Reported on 01/14/2023), Disp: , Rfl:    mometasone (NASONEX) 50 MCG/ACT nasal spray, Place 2 sprays into the nose daily. (Patient not taking: Reported on 01/14/2023), Disp: , Rfl:    mupirocin ointment (BACTROBAN) 2 %, Apply topically 2 (two) times daily. (Patient not taking: Reported on 01/14/2023), Disp: , Rfl:    oseltamivir (TAMIFLU) 75 MG capsule, Tamiflu Take 1 capsule (oral) 2 times per day for 5 days [id number] capsule 2 times per day oral 5 days active 75 MG (Patient not taking: Reported on 01/14/2023), Disp: , Rfl:    predniSONE (STERAPRED UNI-PAK 21 TAB) 10 MG (21) TBPK tablet, Take by mouth daily. Take 6 tabs by mouth daily for 1 day, then 5 tabs for 1 day, then 4 tabs for 1 day, then 3 tabs for 1 day, then 2 tabs for 1 day, then 1 tab by mouth daily for 1 day (Patient not taking: Reported on 01/14/2023), Disp: 21 tablet, Rfl: 0   Ruxolitinib Phosphate (OPZELURA) 1.5 % CREA, , Disp: ,  Rfl:    traZODone (DESYREL) 50 MG tablet, Take 1-2 tablets (50-100 mg total) by mouth at bedtime., Disp: 180 tablet, Rfl: 1   Vilazodone HCl (VIIBRYD) 40 MG TABS, Take 1 tablet (40 mg total) by mouth daily., Disp: 90 tablet, Rfl: 1   zolpidem (AMBIEN) 10 MG tablet, zolpidem 10 mg tablet, Disp: , Rfl:  Medication Side Effects: none  Family Medical/ Social History: Changes? No  MENTAL HEALTH EXAM:  There were no vitals taken for this visit.There is  no height or weight on file to calculate BMI.  General Appearance: Casual, Neat, and Well Groomed  Eye Contact:  Good  Speech:  Clear and Coherent  Volume:  Normal  Mood:  NA  Affect:  Appropriate  Thought Process:  Coherent  Orientation:  Full (Time, Place, and Person)  Thought Content: Logical   Suicidal Thoughts:  No  Homicidal Thoughts:  No  Memory:  WNL  Judgement:  Good  Insight:  Good  Psychomotor Activity:  Normal  Concentration:  Concentration: Good  Recall:  Good  Fund of Knowledge: Good  Language: Good  Assets:  Desire for Improvement  ADL's:  Intact  Cognition: WNL  Prognosis:  Good    DIAGNOSES:    ICD-10-CM   1. Adjustment disorder with mixed anxiety and depressed mood  F43.23 Vilazodone HCl (VIIBRYD) 40 MG TABS    2. Insomnia due to other mental disorder  F51.05 traZODone (DESYREL) 50 MG tablet   F99     3. Moderate episode of recurrent major depressive disorder (HCC)  F33.1 Vilazodone HCl (VIIBRYD) 40 MG TABS      Receiving Psychotherapy: No    RECOMMENDATIONS:   Greater than 50% of 30  min  face to face time with patient was spent on counseling and coordination of care. Discussed his significant improvement with Viibryd. He is very happy with his medication. Requesting no med changes.  We discussed his previous plan of care with his PCP and reviewed his psychiatric medication trials.  It was recommended that he continue in psychotherapy.   We agreed today to:   Continue Viibryd  20 mg daily.  Must take with food. To continue trazodone 50 mg at bedtime for sleep To continue, #20, Xanax 0.25 mg daily only for severe anxiety Will report worsening symptoms or side effects promptly Will follow-up in 8  weeks to reassess Provided emergency contact information number Discussed potential benefits, risk, and side effects of benzodiazepines to include potential risk of tolerance and dependence, as well as possible drowsiness.  Advised patient not to drive if  experiencing drowsiness and to take lowest possible effective dose to minimize risk of dependence and tolerance.  Reviewed PDMP        Joan Flores, NP

## 2023-04-16 ENCOUNTER — Ambulatory Visit: Payer: BC Managed Care – PPO | Admitting: Psychology

## 2023-04-16 DIAGNOSIS — F4323 Adjustment disorder with mixed anxiety and depressed mood: Secondary | ICD-10-CM | POA: Diagnosis not present

## 2023-04-16 NOTE — Progress Notes (Signed)
[facility] Counselor/Therapist Progress Note  Patient ID: Alexander Turner, MRN: [id number],    Date: 04/16/2023  Time Spent: 45  mins; start time: 1600; stop time: 1645  Treatment Type: Individual Therapy  Reported Symptoms: Pt presents for the session via Caregility video  He states that he is in his home; he grants consent for the session and states that he understands the limits of virtual sessions.  I shared with pt that I am in my office with no one else present here.    Mental Status Exam: Appearance:  Casual     Behavior: Appropriate  Motor: Normal  Speech/Language:  Clear and Coherent  Affect: Appropriate  Mood: normal  Thought process: normal  Thought content:   WNL  Sensory/Perceptual disturbances:   WNL  Orientation: oriented to person, place, and time/date  Attention: Good  Concentration: Good  Memory: WNL  Fund of knowledge:  Good  Insight:   Good  Judgment:  Good  Impulse Control: Good   Risk Assessment: Danger to Self:  No Self-injurious Behavior: No Danger to Others: No Duty to Warn:no Physical Aggression / Violence:No  Access to Firearms a concern: No  Gang Involvement:No   Subjective: Pt shares that "I did talk with Alexander Turner again last week about my increased the dose of my medicine and it has still been a [name] for me.  I am able to think and feel so much more clear.  I am feeling better than I thought I could feel.  My brain has calmed down and that feels great."  Pt shares he is sleeping well with Trazodone now and he feels more rested.  He is exercising twice a week at least.  Pt shares that he talked to his mom recently and told her his story and she had been worried about him.  He was happy to be able to tell her he is in such a better place now.  He also talked with his sister who is being very supportive of him.  Pt shares that he is excited about Christmas this year and he actually wants to be around people this year and  that is so new for him.  Pt shares that Alexander Turner's father did pass away right before Thanksgiving; Hospice was great with the family and her dad.  Alexander Turner's mom is handling the loss as well as can be expected.  Pt shares that Alexander Turner continues to struggle; they are still looking for a therapist who specializes in working with clients on the spectrum.  Pt shares that he is isolating a bit more from them.  Pt is less stressed by it and his major focus in on Alexander Turner right now and what her needs are.  Pt shares that Alexander Turner and Alexander Turner are doing well.  Pt is taking all of next week and the first few days of the next week; pt is actually considering slowing down at work and his boss has offered some alternatives for that as well.  He and Alexander Turner are talking about what is best for him.  Pt continues to walk his dog regularly and enjoys that time.  Encouraged pt to continue to engage with his self care activities as intentionally as he can and we will meet in 4 wks for a follow up session.  Interventions: Cognitive Behavioral Therapy  Diagnosis:Adjustment disorder with mixed anxiety and depressed mood  Plan: Treatment Plan Strengths/Abilities:  Intelligent, Intuitive, Willing to participate in therapy Treatment Preferences:  Outpatient Individual  Therapy Statement of Needs:  Patient is to use CBT, mindfulness and coping skills to help manage and/or decrease symptoms associated with their diagnosis. Symptoms:  Depressed/Irritable mood, worry, social withdrawal Problems Addressed:  Depressive thoughts, Sadness, Sleep issues, etc. Long Term Goals:  Pt to reduce overall level, frequency, and intensity of the feelings of depression/anxiety as evidenced by decreased irritability, negative self talk, and helpless feelings from 6 to 7 days/week to 0 to 1 days/week, per client report, for at least 3 consecutive months.  Progress: 20% Short Term Goals:  Pt to verbally express understanding of the relationship between feelings of  depression/anxiety and their impact on thinking patterns and behaviors.  Pt to verbalize an understanding of the role that distorted thinking plays in creating fears, excessive worry, and ruminations.  Progress: 20% Target Date:  07/29/2023 Frequency:  Bi-weekly Modality:  Cognitive Behavioral Therapy Interventions by Therapist:  Therapist will use CBT, Mindfulness exercises, Coping skills and Referrals, as needed by client. Client has verbally approved this treatment plan.  Alexander Turner, [facility]

## 2023-05-12 ENCOUNTER — Ambulatory Visit: Payer: BC Managed Care – PPO | Admitting: Psychology

## 2023-05-12 DIAGNOSIS — F4323 Adjustment disorder with mixed anxiety and depressed mood: Secondary | ICD-10-CM | POA: Diagnosis not present

## 2023-05-12 NOTE — Progress Notes (Signed)
 [facility] Counselor/Therapist Progress Note  Patient ID: Alexander Turner, MRN: [id number],    Date: 05/12/2023  Time Spent: 45  mins; start time: 1600; stop time: 1645  Treatment Type: Individual Therapy  Reported Symptoms: Pt presents for the session via Caregility video  He states that he is in his home; he grants consent for the session and states that he understands the limits of virtual sessions.  I shared with pt that I am in my office with no one else present here.    Mental Status Exam: Appearance:  Casual     Behavior: Appropriate  Motor: Normal  Speech/Language:  Clear and Coherent  Affect: Appropriate  Mood: normal  Thought process: normal  Thought content:   WNL  Sensory/Perceptual disturbances:   WNL  Orientation: oriented to person, place, and time/date  Attention: Good  Concentration: Good  Memory: WNL  Fund of knowledge:  Good  Insight:   Good  Judgment:  Good  Impulse Control: Good   Risk Assessment: Danger to Self:  No Self-injurious Behavior: No Danger to Others: No Duty to Warn:no Physical Aggression / Violence:No  Access to Firearms a concern: No  Gang Involvement:No   Subjective: Pt shares that Happy New Year to you and your family.  We spent New Year's Eve with friends.  [name]'s dad's celebration of life was the weekend after New Year's and pt shares that  event went well.  Pt shares he is continuing to feel so much better about his life and the progress he has made in his life since we started working together.  Pt shares that Sherrilee has told them recently that she is pregnant and is due in August 2025; pt and [name] were surprised at first and are now excited about being grandparents.  Pt shares he believes that they will likely stay in the [place] area because of Brian's work.  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  He told her he was not interested in taking on more but was happy to maintain what he is doing but did not want to pad his resume anymore.  Pt feels really good about his conversations with his boss and how he advocated for himself.  His reporting structure will change at work but he believes that will be good as well; [name] is supportive of his decisions.  Pt is grateful that he was given a choice by his boss and that [name] has supported his decision making.  Pt continues to take his medication daily and knows it is helping him feel so much better.  Pt shares he was recently talking with a friend of his and was talking about his medication benefits and his friend just told pt that he had recently started Lexapro for himself and they were both surprised about this because guys normally do not discuss this topic among each other.  Pt shares he is sleeping well with Trazodone  now and he feels more rested.  He is exercising twice a week at least.  Pt shares that he and [name] are going to [place] to see the [place] with friends in March; he is planning to go to [place] to play golf with friends, etc.  Pt shares that [name] is helping [name] get covered by Medicaid and hopes to be able to access more services for  him; trying to connect him with more services that can benefit him.  Pt is trying to remain patient with this situation; it is better than it has been.  Encouraged pt to continue to engage with his self care activities as intentionally as he can and we will meet in 4 wks for a follow up session.  Interventions: Cognitive Behavioral Therapy  Diagnosis:Adjustment disorder with mixed anxiety and depressed mood  Plan: Treatment Plan Strengths/Abilities:  Intelligent, Intuitive, Willing to participate in therapy Treatment Preferences:  Outpatient Individual Therapy Statement of Needs:  Patient is to use CBT, mindfulness and coping skills to help manage and/or decrease symptoms  associated with their diagnosis. Symptoms:  Depressed/Irritable mood, worry, social withdrawal Problems Addressed:  Depressive thoughts, Sadness, Sleep issues, etc. Long Term Goals:  Pt to reduce overall level, frequency, and intensity of the feelings of depression/anxiety as evidenced by decreased irritability, negative self talk, and helpless feelings from 6 to 7 days/week to 0 to 1 days/week, per client report, for at least 3 consecutive months.  Progress: 20% Short Term Goals:  Pt to verbally express understanding of the relationship between feelings of depression/anxiety and their impact on thinking patterns and behaviors.  Pt to verbalize an understanding of the role that distorted thinking plays in creating fears, excessive worry, and ruminations.  Progress: 20% Target Date:  07/29/2023 Frequency:  Bi-weekly Modality:  Cognitive Behavioral Therapy Interventions by Therapist:  Therapist will use CBT, Mindfulness exercises, Coping skills and Referrals, as needed by client. Client has verbally approved this treatment plan.  Francis KATHEE Macintosh, [facility]

## 2023-06-09 ENCOUNTER — Ambulatory Visit: Payer: BC Managed Care – PPO | Admitting: Psychology

## 2023-06-09 DIAGNOSIS — F4323 Adjustment disorder with mixed anxiety and depressed mood: Secondary | ICD-10-CM | POA: Diagnosis not present

## 2023-06-09 NOTE — Progress Notes (Signed)
[facility] Counselor/Therapist Progress Note  Patient ID: Alexander Turner, MRN: [id number],    Date: 06/09/2023  Time Spent: 50  mins; start time: 1600; stop time: 1650  Treatment Type: Individual Therapy  Reported Symptoms: Pt presents for the session via Caregility video  He states that he is in his home; he grants consent for the session and states that he understands the limits of virtual sessions.  I shared with pt that I am in my office with no one else present here.    Mental Status Exam: Appearance:  Casual     Behavior: Appropriate  Motor: Normal  Speech/Language:  Clear and Coherent  Affect: Appropriate  Mood: normal  Thought process: normal  Thought content:   WNL  Sensory/Perceptual disturbances:   WNL  Orientation: oriented to person, place, and time/date  Attention: Good  Concentration: Good  Memory: WNL  Fund of knowledge:  Good  Insight:   Good  Judgment:  Good  Impulse Control: Good   Risk Assessment: Danger to Self:  No Self-injurious Behavior: No Danger to Others: No Duty to Warn:no Physical Aggression / Violence:No  Access to Firearms a concern: No  Gang Involvement:No   Subjective: Pt shares that "Overall, I have been in a good head space lately.  Alexander Turner is still missing her dad since he passed away last month.  Her mom Alexander Turner) came up to visit with [place] this past weekend and we had a really nice visit with her.  She seemed to be doing pretty well.  She continues to run the family business in [place] and that seems to be helping her too."  Pt also shares that he continues to take his medication daily and he is feeling well.  He and Alexander Turner have several trips coming up in the next few months Alexander Turner in March, [place] to see Alexander Turner in April, [place] in May, etc).  Pt shares that he and Alexander Turner and his mother-in-law went to a [employer] party at [place] and [place] home on Sunday.  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  Pt is not comfortable with how she is acting with and he actually made an anonymous report to compliance about how Alexander Turner is running the department.  HR reached out to him and they are pursuing other voices from the company.  Pt is glad that he has taken the risk of talking to people and telling his truth.  Pt shares he is sleeping well with Trazodone now and he feels more rested.  Pt continues to work out twice a week with Alexander Turner and feels that is working well for him; he is intentional about engaging in his self care; he enjoys playing with his new dog and his old dog and their Medical laboratory scientific officer (Alexander Turner).  Pt shares that Alexander Turner is doing OK; he got signed up for Medicaid and can qualify for other programs in the community, including an OT to help with finding appropriate employment options.  He is walking daily and seems to be maintaining so far.  Pt continues to try to be patient with Alexander Turner as he tries to find his way in the world.  Pt shares that Alexander Turner is fine with her pregnancy and they are thankful  for that; she is due in August.  Encouraged pt to continue to engage with his self care activities as intentionally as he can and we will meet in 3 wks for a follow up session.  Interventions: Cognitive Behavioral Therapy  Diagnosis:Adjustment disorder with mixed anxiety and depressed mood  Plan: Treatment Plan Strengths/Abilities:  Intelligent, Intuitive, Willing to participate in therapy Treatment Preferences:  Outpatient Individual Therapy Statement of Needs:  Patient is to use CBT, mindfulness and coping skills to help manage and/or decrease symptoms associated with their diagnosis. Symptoms:  Depressed/Irritable mood, worry, social withdrawal Problems Addressed:  Depressive thoughts, Sadness, Sleep issues, etc. Long Term  Goals:  Pt to reduce overall level, frequency, and intensity of the feelings of depression/anxiety as evidenced by decreased irritability, negative self talk, and helpless feelings from 6 to 7 days/week to 0 to 1 days/week, per client report, for at least 3 consecutive months.  Progress: 20% Short Term Goals:  Pt to verbally express understanding of the relationship between feelings of depression/anxiety and their impact on thinking patterns and behaviors.  Pt to verbalize an understanding of the role that distorted thinking plays in creating fears, excessive worry, and ruminations.  Progress: 20% Target Date:  07/29/2023 Frequency:  Bi-weekly Modality:  Cognitive Behavioral Therapy Interventions by Therapist:  Therapist will use CBT, Mindfulness exercises, Coping skills and Referrals, as needed by client. Client has verbally approved this treatment plan.  [name], [facility]

## 2023-06-23 ENCOUNTER — Other Ambulatory Visit: Payer: Self-pay | Admitting: Internal Medicine

## 2023-06-23 DIAGNOSIS — Z136 Encounter for screening for cardiovascular disorders: Secondary | ICD-10-CM

## 2023-06-30 ENCOUNTER — Ambulatory Visit: Payer: BC Managed Care – PPO | Admitting: Psychology

## 2023-06-30 DIAGNOSIS — F4323 Adjustment disorder with mixed anxiety and depressed mood: Secondary | ICD-10-CM | POA: Diagnosis not present

## 2023-06-30 NOTE — Progress Notes (Signed)
  Behavioral Health Counselor/Therapist Progress Note  Patient ID: Alexander Turner, MRN: [id number],    Date: 06/30/2023  Time Spent: 45  mins; start time: 1600; stop time: 1645  Treatment Type: Individual Therapy  Reported Symptoms: Pt presents for the session via Caregility video  He states that he is in his home; he grants consent for the session and states that he understands the limits of virtual sessions.  I shared with pt that I am in my office with no one else present here.    Mental Status Exam: Appearance:  Casual     Behavior: Appropriate  Motor: Normal  Speech/Language:  Clear and Coherent  Affect: Appropriate  Mood: normal  Thought process: normal  Thought content:   WNL  Sensory/Perceptual disturbances:   WNL  Orientation: oriented to person, place, and time/date  Attention: Good  Concentration: Good  Memory: WNL  Fund of knowledge:  Good  Insight:   Good  Judgment:  Good  Impulse Control: Good   Risk Assessment: Danger to Self:  No Self-injurious Behavior: No Danger to Others: No Duty to Warn:no Physical Aggression / Violence:No  Access to Firearms a concern: No  Gang Involvement:No   Subjective: Pt shares that "Overall, I have been in a good head space lately.  I feel good but I have also been really busy.  We have had several friends who have lost their fathers lately.  Thankfully, I have had emotional energy to be there for them and be helpful to them."  Pt shares that his former boss, Alexander Turner, is still with the company and he is disappointed that the leadership has not acted in a more reasonable way.  He has shared with HR and leadership that he is working in a hostile work environment and he has acquired Optomet[employer] to support him through this situation.  Pt has been able to retain the two employees she told him to fire and he did not want to do that.  Those two employees are scared for themselves but are trying to stay around.  Pt is  beginning to consider what might be next for him; "there are lots of ways to make the money that I need to have but I don't want the decision made for me.  Pt shares that he has included Alexander Turner in all of his thoughts and conversations with his attorney.  Pt shares that his dad and step-mom (poor health and pt is not a big fan of hers) are in the process of getting their wills, powers of attorney, etc completed and it is harder than it needs to be.  Pt shares that Alexander Turner is doing well with her pregnancy (due in August).  Pt also shares that Alexander Turner got hold of their credit card without permission and they found out and told him that it was theft and he could be charged.  Last weekend he got his grandmother's credit card and charged a boxing match on it without his grandmother's permission.  He is not working and they have recently taken his car away.  He did meet with his psychiatrist this past Monday and they switched him to Wellbutrin.  Pt shares that tomorrow is their 31st wedding anniversary and they are planning to go to out to dinner.  Alexander Turner has not felt like she was getting better with her medication so pt suggested that she see Alexander Alexander Turner; she is pursing that option now.  Alexander Turner is still struggling a bit with the  loss of her dad but is managing pretty well.  Pt and Alexander Turner went with friends to the [place] for a couple of nights and they had a good time; they are going to see Alexander Turner and Alexander Turner in a couple of weeks,  they are going to [place] in May.  Pt shares he is sleeping well with Trazodone now and he feels more rested.  Pt continues to work out twice a week with Alexander Turner and feels that is working well for him; he is intentional about engaging in his self care; he enjoys playing with his new dog and his old dog and their Medical laboratory scientific officer (Alexander Turner).  Encouraged pt to continue to engage with his self care activities as intentionally as he can and we will meet in 3 wks for a follow up session.  Interventions: Cognitive  Behavioral Therapy  Diagnosis:Adjustment disorder with mixed anxiety and depressed mood  Plan: Treatment Plan Strengths/Abilities:  Intelligent, Intuitive, Willing to participate in therapy Treatment Preferences:  Outpatient Individual Therapy Statement of Needs:  Patient is to use CBT, mindfulness and coping skills to help manage and/or decrease symptoms associated with their diagnosis. Symptoms:  Depressed/Irritable mood, worry, social withdrawal Problems Addressed:  Depressive thoughts, Sadness, Sleep issues, etc. Long Term Goals:  Pt to reduce overall level, frequency, and intensity of the feelings of depression/anxiety as evidenced by decreased irritability, negative self talk, and helpless feelings from 6 to 7 days/week to 0 to 1 days/week, per client report, for at least 3 consecutive months.  Progress: 20% Short Term Goals:  Pt to verbally express understanding of the relationship between feelings of depression/anxiety and their impact on thinking patterns and behaviors.  Pt to verbalize an understanding of the role that distorted thinking plays in creating fears, excessive worry, and ruminations.  Progress: 20% Target Date:  07/28/2024 Frequency:  Bi-weekly Modality:  Cognitive Behavioral Therapy Interventions by Therapist:  Therapist will use CBT, Mindfulness exercises, Coping skills and Referrals, as needed by client. Client has verbally approved this treatment plan.  Alexander Turner, [facility]

## 2023-07-14 ENCOUNTER — Ambulatory Visit
Admission: RE | Admit: 2023-07-14 | Discharge: 2023-07-14 | Disposition: A | Discharge status: Home / Self Care | Payer: Self-pay | Source: Ambulatory Visit | Attending: Internal Medicine | Admitting: Internal Medicine

## 2023-07-14 DIAGNOSIS — Z136 Encounter for screening for cardiovascular disorders: Secondary | ICD-10-CM | POA: Insufficient documentation

## 2023-07-15 ENCOUNTER — Encounter: Payer: Self-pay | Admitting: Behavioral Health

## 2023-07-15 ENCOUNTER — Ambulatory Visit: Payer: BC Managed Care – PPO | Admitting: Behavioral Health

## 2023-07-15 DIAGNOSIS — F5105 Insomnia due to other mental disorder: Secondary | ICD-10-CM | POA: Diagnosis not present

## 2023-07-15 DIAGNOSIS — F4323 Adjustment disorder with mixed anxiety and depressed mood: Secondary | ICD-10-CM | POA: Diagnosis not present

## 2023-07-15 DIAGNOSIS — F99 Mental disorder, not otherwise specified: Secondary | ICD-10-CM

## 2023-07-15 DIAGNOSIS — F411 Generalized anxiety disorder: Secondary | ICD-10-CM

## 2023-07-15 DIAGNOSIS — F331 Major depressive disorder, recurrent, moderate: Secondary | ICD-10-CM | POA: Diagnosis not present

## 2023-07-15 MED ORDER — ALPRAZOLAM 0.25 MG PO TABS: ORAL_TABLET | ORAL | 0 refills | Status: DC

## 2023-07-15 MED ORDER — TRAZODONE HCL 50 MG PO TABS: 50.0000 mg | ORAL_TABLET | Freq: Every day | ORAL | 1 refills | Status: DC

## 2023-07-15 MED ORDER — VILAZODONE HCL 40 MG PO TABS: 40.0000 mg | ORAL_TABLET | Freq: Every day | ORAL | 1 refills | Status: DC

## 2023-07-15 MED ORDER — ZOLPIDEM TARTRATE 10 MG PO TABS: 10.0000 mg | ORAL_TABLET | Freq: Every day | ORAL | 3 refills | Status: DC

## 2023-07-15 NOTE — Progress Notes (Signed)
 [facility]  Patient ID: Alexander Turner,  MRN: [id number]  PCP: Lauro Regulus, MD  Date of Evaluation: 07/15/2023 Time spent:30 minutes  Chief Complaint:  Chief Complaint   Depression; Anxiety; Follow-up; Medication Refill; Patient Education     HISTORY/CURRENT STATUS: HPI  "Alexander Turner", 58 year old male reports to this office for follow up and medication management.  Collateral information should be considered reliable.  Smiling today, and very happy with his medication. Say he simply feels good.  Says the stressors around him have softened some. He is requesting no medication changes today.  Patient reports his depression today at 1/10 and anxiety at 1/10.   Patient states that he currently feels safe.  Verbally contracted for safety with this Clinical research associate.  Reports no history of mania, no psychosis, no auditory or visual hallucinations.  No SI or HI.   Past psychiatric medication trials: Wellbutrin-Disturbing thoughts Lexapro-Report non effective, sexual side effects BuSpar-non effective Xanax      Individual Medical History/ Review of Systems: Changes? :No   Allergies: Doxycycline, Chlorhexidine, and Chlorhexidine gluconate  Current Medications:  Current Outpatient Medications:    albuterol (VENTOLIN HFA) 108 (90 Base) MCG/ACT inhaler, Inhale into the lungs. (Patient not taking: Reported on 01/14/2023), Disp: , Rfl:    ALPRAZolam (XANAX) 0.25 MG tablet, Take one tablet by mouth daily for severe anxiety, Disp: 20 tablet, Rfl: 0   Azelastine HCl 137 MCG/SPRAY SOLN, , Disp: , Rfl:    azithromycin (ZITHROMAX Z-PAK) 250 MG tablet, Take 2 tablets (500 mg) today, then 1 tablet (250 mg) for next 4 days. (Patient not taking: Reported on 01/14/2023), Disp: 6 tablet, Rfl: 0   brompheniramine-pseudoephedrine-DM (BROMFED DM) 30-2-10 MG/5ML syrup, Bromfed DM Take 10 mL (oral) every 6 hours PRN - Cough [id number] syrup every 6 hours oral No set duration recorded days active  2-30-10 mg/5 mL, Disp: , Rfl:    buPROPion (WELLBUTRIN XL) 300 MG 24 hr tablet, Take by mouth. (Patient not taking: Reported on 01/14/2023), Disp: , Rfl:    celecoxib (CELEBREX) 100 MG capsule, Take 100 mg by mouth 2 (two) times daily. (Patient not taking: Reported on 01/14/2023), Disp: , Rfl:    escitalopram (LEXAPRO) 10 MG tablet, , Disp: , Rfl:    fluticasone (FLONASE) 50 MCG/ACT nasal spray, Place 2 sprays into both nostrils daily., Disp: , Rfl:    HYDROcodone-acetaminophen (NORCO/VICODIN) 5-325 MG tablet, Take by mouth. (Patient not taking: Reported on 01/14/2023), Disp: , Rfl:    meloxicam (MOBIC) 15 MG tablet, Take 1 tablet every day by oral route. (Patient not taking: Reported on 01/14/2023), Disp: , Rfl:    mometasone (NASONEX) 50 MCG/ACT nasal spray, Place 2 sprays into the nose daily. (Patient not taking: Reported on 01/14/2023), Disp: , Rfl:    mupirocin ointment (BACTROBAN) 2 %, Apply topically 2 (two) times daily. (Patient not taking: Reported on 01/14/2023), Disp: , Rfl:    oseltamivir (TAMIFLU) 75 MG capsule, Tamiflu Take 1 capsule (oral) 2 times per day for 5 days [id number] capsule 2 times per day oral 5 days active 75 MG (Patient not taking: Reported on 01/14/2023), Disp: , Rfl:    predniSONE (STERAPRED UNI-PAK 21 TAB) 10 MG (21) TBPK tablet, Take by mouth daily. Take 6 tabs by mouth daily for 1 day, then 5 tabs for 1 day, then 4 tabs for 1 day, then 3 tabs for 1 day, then 2 tabs for 1 day, then 1 tab by mouth daily for 1 day (Patient not taking: Reported  on 01/14/2023), Disp: 21 tablet, Rfl: 0   Ruxolitinib Phosphate (OPZELURA) 1.5 % CREA, , Disp: , Rfl:    traZODone (DESYREL) 50 MG tablet, Take 1-2 tablets (50-100 mg total) by mouth at bedtime., Disp: 180 tablet, Rfl: 1   Vilazodone HCl (VIIBRYD) 40 MG TABS, Take 1 tablet (40 mg total) by mouth daily., Disp: 90 tablet, Rfl: 1   zolpidem (AMBIEN) 10 MG tablet, Take 1 tablet (10 mg total) by mouth at bedtime., Disp: 30 tablet, Rfl:  3 Medication Side Effects: none  Family Medical/ Social History: Changes? No  MENTAL HEALTH EXAM:  There were no vitals taken for this visit.There is no height or weight on file to calculate BMI.  General Appearance: Casual, Neat, and Well Groomed  Eye Contact:  NA  Speech:  Clear and Coherent  Volume:  Normal  Mood:  NA  Affect:  Appropriate  Thought Process:  Coherent  Orientation:  Full (Time, Place, and Person)  Thought Content: Logical   Suicidal Thoughts:  No  Homicidal Thoughts:  No  Memory:  WNL  Judgement:  NA  Insight:  NA  Psychomotor Activity:  NA  Concentration:  Concentration: Good  Recall:  Good  Fund of Knowledge: Good  Language: Good  Assets:  Desire for Improvement  ADL's:  Intact  Cognition: WNL  Prognosis:  Good    DIAGNOSES:    ICD-10-CM   1. Adjustment disorder with mixed anxiety and depressed mood  F43.23 Vilazodone HCl (VIIBRYD) 40 MG TABS    2. Moderate episode of recurrent major depressive disorder (HCC)  F33.1 Vilazodone HCl (VIIBRYD) 40 MG TABS    3. Insomnia due to other mental disorder  F51.05 traZODone (DESYREL) 50 MG tablet   F99 zolpidem (AMBIEN) 10 MG tablet    4. Generalized anxiety disorder  F41.1 ALPRAZolam (XANAX) 0.25 MG tablet      Receiving Psychotherapy: No    RECOMMENDATIONS:   Greater than 50% of 30  min  face to face time with patient was spent on counseling and coordination of care. We discussed  great improvement with his depression and anxiety to near zero levels.  Requesting no medication adjustments today.   It was recommended that he continue in psychotherapy.   We agreed today to:   Continue Viibryd  20 mg daily.  Must take with food. To continue trazodone 50 mg at bedtime for sleep Continue Ambien 10 mg as  needed. To continue, #20, Xanax 0.25 mg daily only for severe anxiety Will report worsening symptoms or side effects promptly Will follow-up in 6 month to reassess or sooner if condition  changes Provided emergency contact information number Discussed potential benefits, risk, and side effects of benzodiazepines to include potential risk of tolerance and dependence, as well as possible drowsiness.  Advised patient not to drive if experiencing drowsiness and to take lowest possible effective dose to minimize risk of dependence and tolerance.  Reviewed PDMP    Joan Flores, NP

## 2023-07-28 ENCOUNTER — Ambulatory Visit (INDEPENDENT_AMBULATORY_CARE_PROVIDER_SITE_OTHER): Admitting: Psychology

## 2023-07-28 DIAGNOSIS — F4323 Adjustment disorder with mixed anxiety and depressed mood: Secondary | ICD-10-CM

## 2023-07-28 NOTE — Progress Notes (Signed)
 [facility] Counselor/Therapist Progress Note  Patient ID: Alexander Turner, MRN: [id number],    Date: 07/28/2023  Time Spent: 30  mins; start time: 1400; stop time: 1430  Treatment Type: Individual Therapy  Reported Symptoms: Pt presents for the session via Caregility video  He states that he is in his home; he grants consent for the session and states that he understands the limits of virtual sessions.  I shared with pt that I am in my office with no one else present here.    Mental Status Exam: Appearance:  Casual     Behavior: Appropriate  Motor: Normal  Speech/Language:  Clear and Coherent  Affect: Appropriate  Mood: normal  Thought process: normal  Thought content:   WNL  Sensory/Perceptual disturbances:   WNL  Orientation: oriented to person, place, and time/date  Attention: Good  Concentration: Good  Memory: WNL  Fund of knowledge:  Good  Insight:   Good  Judgment:  Good  Impulse Control: Good   Risk Assessment: Danger to Self:  No Self-injurious Behavior: No Danger to Others: No Duty to Warn:no Physical Aggression / Violence:No  Access to Firearms a concern: No  Gang Involvement:No   Subjective: Pt shares that "Overall, I have been really good lately.  Things are really going well.  We went down to [place] to see [name] (due in August) and our son-in-law and had a great visit.  We went to [place] last weekend to see [name] and that was a good visit.  Work is going OK; it is still a bit difficult with my old boss still being there but it is OK."  Pt is a bit frustrated that the company was not willing to deal with his boss the way she needed to be dealt with but he realizes that is there decision and not his.  Pt shares he "continues to count my blessings.  I am grateful and thankful for where I am and what is going on in my life.  I am optimistic about the future."  Pt and [name] are going to [place] to see the [place] and are going to [place] with  [name] and her husband.  [name] is seeing [name], NP at [facility] and feels good about how that is going.  Pt shares that [name] is still struggling although he is scheduled to start a new job soon; [name] stopped his antidepressant recently without telling his doctor that he did not like how the medication made him feel.  They are encouraging him to talk with his doctor.  They got him a new phone that shuts down at 9pm and that has helped him get back on a more regular schedule.  Pt shares that he is aware that [name] is still spending money on porn and pt has talked with him about that again.  Pt is intentional about engaging in his self care; he enjoys playing with his new dog and his old dog and their cat (Archie).  Encouraged pt to continue to engage with his self care activities as intentionally as he can and we will meet in 5 wks for a follow up session, due to pt's travel schedule.  Interventions: Cognitive Behavioral Therapy  Diagnosis:Adjustment disorder with mixed anxiety and depressed mood  Plan: Treatment Plan Strengths/Abilities:  Intelligent, Intuitive, Willing to participate in therapy Treatment Preferences:  Outpatient Individual Therapy Statement of Needs:  Patient is to use CBT, mindfulness and coping skills to help manage and/or decrease symptoms associated  with their diagnosis. Symptoms:  Depressed/Irritable mood, worry, social withdrawal Problems Addressed:  Depressive thoughts, Sadness, Sleep issues, etc. Long Term Goals:  Pt to reduce overall level, frequency, and intensity of the feelings of depression/anxiety as evidenced by decreased irritability, negative self talk, and helpless feelings from 6 to 7 days/week to 0 to 1 days/week, per client report, for at least 3 consecutive months.  Progress: 20% Short Term Goals:  Pt to verbally express understanding of the relationship between feelings of depression/anxiety and their impact on thinking patterns and behaviors.  Pt to  verbalize an understanding of the role that distorted thinking plays in creating fears, excessive worry, and ruminations.  Progress: 20% Target Date:  07/28/2024 Frequency:  Bi-weekly Modality:  Cognitive Behavioral Therapy Interventions by Therapist:  Therapist will use CBT, Mindfulness exercises, Coping skills and Referrals, as needed by client. Client has verbally approved this treatment plan.  [name], [facility]

## 2023-09-01 ENCOUNTER — Ambulatory Visit: Admitting: Psychology

## 2023-09-10 ENCOUNTER — Ambulatory Visit (INDEPENDENT_AMBULATORY_CARE_PROVIDER_SITE_OTHER): Admitting: Psychology

## 2023-09-10 DIAGNOSIS — F4323 Adjustment disorder with mixed anxiety and depressed mood: Secondary | ICD-10-CM

## 2023-09-10 NOTE — Progress Notes (Signed)
 [facility] Counselor/Therapist Progress Note  Patient ID: Alexander Turner, MRN: [id number],    Date: 09/10/2023  Time Spent: 30  mins; start time: 1500; stop time: 1530  Treatment Type: Individual Therapy  Reported Symptoms: Pt presents for the session via Caregility video  He states that he is in his home; he grants consent for the session and states that he understands the limits of virtual sessions.  I shared with pt that I am in my office with no one else present here.    Mental Status Exam: Appearance:  Casual     Behavior: Appropriate  Motor: Normal  Speech/Language:  Clear and Coherent  Affect: Appropriate  Mood: normal  Thought process: normal  Thought content:   WNL  Sensory/Perceptual disturbances:   WNL  Orientation: oriented to person, place, and time/date  Attention: Good  Concentration: Good  Memory: WNL  Fund of knowledge:  Good  Insight:   Good  Judgment:  Good  Impulse Control: Good   Risk Assessment: Danger to Self:  No Self-injurious Behavior: No Danger to Others: No Duty to Warn:no Physical Aggression / Violence:No  Access to Firearms a concern: No  Gang Involvement:No   Subjective: Pt shares that "I have been really good since our last session.  I, and others at work, are very surprised at the lack of action against my former boss.  She is still there and it is clear that leadership is OK with that.  I am glad that I have a buffer now between me and her and I am planning to retire at the end of the year or the end of the first quarter."  Pt shares that his daughter is doing well with the pregnancy and everything is going well; due in August.  They saw her in [place]  a couple of weeks ago and she looked great.  Pt shares that [name] has a job now, working 2 days per week at the [employer]  at [employer] on the weekends.  They are planning to go to [facility] to visit with them again over [facility] Day.  Pt is continuing to focus on self  care; playing golf, walking the dogs, spending time with Arlice Bene visited with her mom last week at [facility] and her mom is doing well with the loss of her husband.  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  Pt is mulling over what might be best for next steps for the business.  [name] is doing well on her antidepressant and pt is happy about that.  Pt and [name] also just bought her dad's 63' boat and pt has enjoyed learning how to use it and benefit from it.  Encouraged pt to continue to engage with his self care activities as intentionally as he can and we will meet in 4 wks for a follow up session.  Interventions: Cognitive Behavioral Therapy  Diagnosis:Adjustment disorder with mixed anxiety and depressed mood  Plan: Treatment Plan Strengths/Abilities:  Intelligent, Intuitive, Willing to participate in therapy Treatment Preferences:  Outpatient Individual Therapy Statement of Needs:  Patient is to use CBT, mindfulness and coping skills to help manage and/or decrease symptoms associated with their diagnosis. Symptoms:  Depressed/Irritable mood, worry, social withdrawal Problems Addressed:  Depressive thoughts, Sadness, Sleep issues, etc. Long Term Goals:  Pt to reduce overall level, frequency, and intensity of the feelings of depression/anxiety as evidenced by decreased irritability, negative self talk, and helpless feelings from  6 to 7 days/week to 0 to 1 days/week, per client report, for at least 3 consecutive months.  Progress: 20% Short Term Goals:  Pt to verbally express understanding of the relationship between feelings of depression/anxiety and their impact on thinking patterns and behaviors.  Pt to verbalize an understanding of the role that distorted thinking plays in creating fears, excessive worry, and ruminations.  Progress: 20% Target Date:  07/28/2024 Frequency:  Bi-weekly Modality:  Cognitive Behavioral Therapy Interventions by  Therapist:  Therapist will use CBT, Mindfulness exercises, Coping skills and Referrals, as needed by client. Client has verbally approved this treatment plan.  [name], [facility]

## 2023-10-13 ENCOUNTER — Ambulatory Visit (INDEPENDENT_AMBULATORY_CARE_PROVIDER_SITE_OTHER): Admitting: Psychology

## 2023-10-13 DIAGNOSIS — F4323 Adjustment disorder with mixed anxiety and depressed mood: Secondary | ICD-10-CM | POA: Diagnosis not present

## 2023-10-13 NOTE — Progress Notes (Signed)
 [facility] Counselor/Therapist Progress Note  Patient ID: Alexander Turner, MRN: [id number],    Date: 10/13/2023  Time Spent: 42  mins; start time: 1305; stop time: 1347  Treatment Type: Individual Therapy  Reported Symptoms: Pt presents for the session via Caregility video  He states that he is in his home; he grants consent for the session and states that he understands the limits of virtual sessions.  I shared with pt that I am in my office with no one else present here.    Mental Status Exam: Appearance:  Casual     Behavior: Appropriate  Motor: Normal  Speech/Language:  Clear and Coherent  Affect: Appropriate  Mood: normal  Thought process: normal  Thought content:   WNL  Sensory/Perceptual disturbances:   WNL  Orientation: oriented to person, place, and time/date  Attention: Good  Concentration: Good  Memory: WNL  Fund of knowledge:  Good  Insight:   Good  Judgment:  Good  Impulse Control: Good   Risk Assessment: Danger to Self:  No Self-injurious Behavior: No Danger to Others: No Duty to Warn:no Physical Aggression / Violence:No  Access to Firearms a concern: No  Gang Involvement:No   Subjective: Pt shares that I have been pretty good since our last session.  [place] and have been getting out on the boat some.  Pt shares that work is still stressful; pt still believes his old boss is still wanting to downsize his team and is looking for ways to do that; his team is aware of what is going on and they are all working together.  Alexander Turner and Alexander Turner came home this past weekend for Father's Day and pt was happy to see them.  Alexander Turner is still maintaining his weekend job at [employer] and pt is happy about that.  Alexander Turner invited them to come see where he works and pt appreciated that event.  Alexander Turner continues to do his own laundry, mows the yard, etc.  They had a baby shower for Alexander Turner on Saturday; due date is 8/21.  He has  golf tournament this coming weekend [place] the [place] and then will be going back to the beach over the 4th of July; continues walking the dogs daily as well.  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  Pt continues to engage in self care activities on a regular basis.  Pt continues to think about retiring, either at the end of this year or the first quarter of 2026.  Pt shares that he and Alexander Turner are in a good spot; we are doing stuff together and it seems to be going well.  Pt and Alexander Turner continue to do well with both of their medications.  Encouraged pt to continue to engage with his self care activities as intentionally as he can and we will meet in 4 wks for a follow up session.  Interventions: Cognitive Behavioral Therapy  Diagnosis:Adjustment disorder with mixed anxiety and depressed mood  Plan: Treatment Plan Strengths/Abilities:  Intelligent, Intuitive, Willing to participate in therapy Treatment Preferences:  Outpatient Individual Therapy Statement of Needs:  Patient is to use CBT, mindfulness and coping skills to help manage and/or decrease symptoms associated with their diagnosis. Symptoms:  Depressed/Irritable mood, worry, social withdrawal Problems Addressed:  Depressive thoughts, Sadness, Sleep issues, etc. Long Term Goals:  Pt to reduce overall level, frequency, and intensity of the feelings of depression/anxiety as evidenced by decreased irritability, negative self talk, and helpless feelings from 6 to 7 days/week to 0 to 1 days/week, per client report, for at least 3 consecutive months.  Progress: 20% Short Term Goals:  Pt to verbally express understanding of the relationship between feelings of depression/anxiety and their impact on thinking patterns and behaviors.  Pt to verbalize an understanding of the role that distorted thinking plays in  creating fears, excessive worry, and ruminations.  Progress: 20% Target Date:  07/28/2024 Frequency:  Bi-weekly Modality:  Cognitive Behavioral Therapy Interventions by Therapist:  Therapist will use CBT, Mindfulness exercises, Coping skills and Referrals, as needed by client. Client has verbally approved this treatment plan.  [name], [facility]

## 2023-11-10 ENCOUNTER — Ambulatory Visit (INDEPENDENT_AMBULATORY_CARE_PROVIDER_SITE_OTHER): Admitting: Psychology

## 2023-11-10 DIAGNOSIS — F4323 Adjustment disorder with mixed anxiety and depressed mood: Secondary | ICD-10-CM

## 2023-11-10 NOTE — Progress Notes (Addendum)
 [facility] Counselor/Therapist Progress Note  Patient ID: Alexander Turner, MRN: [id number],    Date: 11/10/2023  Time Spent: 35  mins; start time: 1300; stop time: 1335  Treatment Type: Individual Therapy  Reported Symptoms: Pt presents for the session via Caregility video  He states that he is in his boat in [place], [place]; he grants consent for the session and states that he understands the limits of virtual sessions.  I shared with pt that I am in my office with no one else present here.    Mental Status Exam: Appearance:  Casual     Behavior: Appropriate  Motor: Normal  Speech/Language:  Clear and Coherent  Affect: Appropriate  Mood: normal  Thought process: normal  Thought content:   WNL  Sensory/Perceptual disturbances:   WNL  Orientation: oriented to person, place, and time/date  Attention: Good  Concentration: Good  Memory: WNL  Fund of knowledge:  Good  Insight:   Good  Judgment:  Good  Impulse Control: Good   Risk Assessment: Danger to Self:  No Self-injurious Behavior: No Danger to Others: No Duty to Warn:no Physical Aggression / Violence:No  Access to Firearms a concern: No  Gang Involvement:No   Subjective: Pt shares that I have been pretty good since our last session.  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  Pt shares that [place] approached her over the 4th of July about it and she came to pt last evening over dinner and told pt she was ready to make the transition of the business to pt and [place].  This will allow pt to retire at the end of the year or the end of the first quarter of 2026.  Work has been good for pt; had a successful negotiation with [employer] and he felt great about that last week.  Pt shares that Sherrilee is doing well with her pregnancy; she and Redell came to [place] for the  4th and they enjoyed the visit; the baby is due on 8/21.  [name] is still maintaining his weekend job at [employer] and pt is happy about that.  Pt shares [name] got together recently with a friend for lunch and went out to eat with pt's mom as well; he also is walking daily and pt appreciates that activity for him.  [name] continues to do his own laundry, mows the yard, etc.  Pt shares that his coworkers in his dept are also looking for opportunities outside of the company once pt leaves the company as well.  Pt shares that he and [name] are in a good spot; we are doing stuff together and it seems to be going well.  Pt and [name] continue to do well with both of their medications.  Encouraged pt to continue to engage with his self care activities as intentionally as he can and we will meet in 4 wks for a follow up session.  Interventions: Cognitive Behavioral Therapy  Diagnosis:Adjustment disorder with mixed anxiety and depressed mood  Plan: Treatment Plan Strengths/Abilities:  Intelligent, Intuitive, Willing to participate in therapy Treatment Preferences:  Outpatient Individual Therapy Statement of Needs:  Patient is to use CBT, mindfulness and coping skills to help manage and/or decrease symptoms associated with their diagnosis. Symptoms:  Depressed/Irritable mood, worry, social withdrawal Problems Addressed:  Depressive thoughts, Sadness, Sleep issues, etc.  Long Term Goals:  Pt to reduce overall level, frequency, and intensity of the feelings of depression/anxiety as evidenced by decreased irritability, negative self talk, and helpless feelings from 6 to 7 days/week to 0 to 1 days/week, per client report, for at least 3 consecutive months.  Progress: 20% Short Term Goals:  Pt to verbally express understanding of the relationship between feelings of depression/anxiety and their impact on thinking patterns and behaviors.  Pt to verbalize an understanding of the role that distorted thinking plays in  creating fears, excessive worry, and ruminations.  Progress: 20% Target Date:  07/28/2024 Frequency:  Bi-weekly Modality:  Cognitive Behavioral Therapy Interventions by Therapist:  Therapist will use CBT, Mindfulness exercises, Coping skills and Referrals, as needed by client. Client has verbally approved this treatment plan.  Francis KATHEE Macintosh, [facility]

## 2023-12-08 ENCOUNTER — Ambulatory Visit: Admitting: Psychology

## 2023-12-08 DIAGNOSIS — F4323 Adjustment disorder with mixed anxiety and depressed mood: Secondary | ICD-10-CM

## 2023-12-08 NOTE — Progress Notes (Signed)
 [facility] Counselor/Therapist Progress Note  Patient ID: Alexander Turner, MRN: [id number],    Date: 12/08/2023  Time Spent: 45  mins; start time: 1300; stop time: 1345  Treatment Type: Individual Therapy  Reported Symptoms: Pt presents for the session via Caregility video  He states that he is in his home with his wife, [name], for today's session; he grants consent for the session and states that he understands the limits of virtual sessions.  I shared with pt that I am in my office with no one else present here.    Mental Status Exam: Appearance:  Casual     Behavior: Appropriate  Motor: Normal  Speech/Language:  Clear and Coherent  Affect: Appropriate  Mood: normal  Thought process: normal  Thought content:   WNL  Sensory/Perceptual disturbances:   WNL  Orientation: oriented to person, place, and time/date  Attention: Good  Concentration: Good  Memory: WNL  Fund of knowledge:  Good  Insight:   Good  Judgment:  Good  Impulse Control: Good   Risk Assessment: Danger to Self:  No Self-injurious Behavior: No Danger to Others: No Duty to Warn:no Physical Aggression / Violence:No  Access to Firearms a concern: No  Gang Involvement:No   Subjective: Pt shares that I have been pretty good since our last session.  I have asked [name] to join us  today because we have some exciting news and a bit of a struggle we are having.  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  Pt shares that he has had some bad news at work; [employer] has now come back and said that their company does not have approval to market their product.  Pt is feeling like his boss and Slater are pushing him to do things around the edges.  He is wondering if they might be trying to push him out before he is  planning to leave in January.  He may choose to talk with an attorney about his work concerns as well; he is considering submitting a case to their compliance office for advice as well.  Pt and [name] continue to do well with both of their medications.  Encouraged pt to continue to engage with his self care activities as intentionally as he can and we will meet in 4 wks for a follow up session.  Interventions: Cognitive Behavioral Therapy  Diagnosis:Adjustment disorder with mixed anxiety and depressed mood  Plan: Treatment Plan Strengths/Abilities:  Intelligent, Intuitive, Willing to participate in therapy Treatment Preferences:  Outpatient Individual Therapy Statement of Needs:  Patient is to use CBT, mindfulness and coping skills to help manage and/or decrease symptoms associated with their diagnosis. Symptoms:  Depressed/Irritable mood, worry, social withdrawal Problems Addressed:  Depressive thoughts, Sadness, Sleep issues, etc. Long Term Goals:  Pt to reduce overall level, frequency, and intensity of the feelings of depression/anxiety as evidenced by decreased irritability, negative self talk, and helpless feelings from 6 to 7 days/week to 0 to 1 days/week, per client report, for at least 3 consecutive months.  Progress: 20% Short Term Goals:  Pt to verbally express understanding of the relationship between feelings of depression/anxiety and their impact on thinking patterns and behaviors.  Pt to verbalize an understanding of the role that distorted thinking plays in creating fears, excessive  worry, and ruminations.  Progress: 20% Target Date:  07/28/2024 Frequency:  Bi-weekly Modality:  Cognitive Behavioral Therapy Interventions by Therapist:  Therapist will use CBT, Mindfulness exercises, Coping skills and Referrals, as needed by client. Client has verbally approved this treatment plan.  Francis KATHEE Macintosh, [facility]

## 2024-01-05 ENCOUNTER — Ambulatory Visit: Admitting: Psychology

## 2024-01-05 DIAGNOSIS — F4323 Adjustment disorder with mixed anxiety and depressed mood: Secondary | ICD-10-CM | POA: Diagnosis not present

## 2024-01-05 NOTE — Progress Notes (Signed)
 [facility] Counselor/Therapist Progress Note  Patient ID: Alexander Turner, MRN: [id number],    Date: 01/05/2024  Time Spent: 45  mins; start time: 1300; stop time: 1345  Treatment Type: Individual Therapy  Reported Symptoms: Pt presents for the session via Caregility video  He states that he is in his home with no one else present; he grants consent for the session and states that he understands the limits of virtual sessions.  I shared with pt that I am in my office with no one else present here.    Mental Status Exam: Appearance:  Casual     Behavior: Appropriate  Motor: Normal  Speech/Language:  Clear and Coherent  Affect: Appropriate  Mood: normal  Thought process: normal  Thought content:   WNL  Sensory/Perceptual disturbances:   WNL  Orientation: oriented to person, place, and time/date  Attention: Good  Concentration: Good  Memory: WNL  Fund of knowledge:  Good  Insight:   Good  Judgment:  Good  Impulse Control: Good   Risk Assessment: Danger to Self:  No Self-injurious Behavior: No Danger to Others: No Duty to Warn:no Physical Aggression / Violence:No  Access to Firearms a concern: No  Gang Involvement:No   Subjective: Pt shares that I have been pretty good since our last session.  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  [name]'s sister, Luke, wants to sell the [employer] and the land it is on.  We met with everyone a couple of weeks ago and Luke (and her husband , Glendia) got kind of nasty demanding that the business be closed and the land be sold.  Everyone kind of calmed down and we had a reasonable conversation.  Graylin is willing to pay for an appraisal of the land and to see if there are any complications that needed to be remediated before it can be sold.  Pt shares that there are financial considerations in the family that need to be understood by [employer] [facility] [employer] and pt.  Pt shares that their grandson was born on  12/11/23 and everyone is doing fine.  They have encountered some financial issues related to the birth and Brian's high deductible insurance plan.  Pt is working on finding a good way to talk with them both about how to help them and also helping them both to be more responsible.  Pt also shares that work is going OK; he has a one-on-one with his boss this afternoon.  He is trying to bide his time for the next several months until the end of Jan or Feb.  Pt has an appt with Redell Pizza next week about his medication update and he plans to talk with him about the possibility of a medical leave to get him through the end of the year.  Pt still feels like his former boss, Slater, still has it out for him and he feels the weight of that.  Pt continues to do well with his medication.  Encouraged pt to continue to engage with his self care activities as intentionally as he can and we will meet in 4 wks for a follow up session.  Interventions: Cognitive Behavioral Therapy  Diagnosis:Adjustment disorder with mixed anxiety and depressed mood  Plan: Treatment Plan Strengths/Abilities:  Intelligent, Intuitive, Willing to participate in therapy Treatment Preferences:  Outpatient Individual Therapy Statement of Needs:  Patient is to use CBT, mindfulness and coping skills to help manage and/or decrease symptoms associated  with their diagnosis. Symptoms:  Depressed/Irritable mood, worry, social withdrawal Problems Addressed:  Depressive thoughts, Sadness, Sleep issues, etc. Long Term Goals:  Pt to reduce overall level, frequency, and intensity of the feelings of depression/anxiety as evidenced by decreased irritability, negative self talk, and helpless feelings from 6 to 7 days/week to 0 to 1 days/week, per client report, for at least 3 consecutive months.  Progress: 20% Short Term Goals:  Pt to verbally express understanding of the relationship between feelings of depression/anxiety and their impact on thinking patterns  and behaviors.  Pt to verbalize an understanding of the role that distorted thinking plays in creating fears, excessive worry, and ruminations.  Progress: 20% Target Date:  07/28/2024 Frequency:  Bi-weekly Modality:  Cognitive Behavioral Therapy Interventions by Therapist:  Therapist will use CBT, Mindfulness exercises, Coping skills and Referrals, as needed by client. Client has verbally approved this treatment plan.  Francis KATHEE Macintosh, [facility]

## 2024-01-15 ENCOUNTER — Ambulatory Visit: Admitting: Behavioral Health

## 2024-01-15 ENCOUNTER — Encounter: Payer: Self-pay | Admitting: Behavioral Health

## 2024-01-15 DIAGNOSIS — F411 Generalized anxiety disorder: Secondary | ICD-10-CM | POA: Diagnosis not present

## 2024-01-15 DIAGNOSIS — F4323 Adjustment disorder with mixed anxiety and depressed mood: Secondary | ICD-10-CM | POA: Diagnosis not present

## 2024-01-15 DIAGNOSIS — F5105 Insomnia due to other mental disorder: Secondary | ICD-10-CM | POA: Diagnosis not present

## 2024-01-15 DIAGNOSIS — F99 Mental disorder, not otherwise specified: Secondary | ICD-10-CM

## 2024-01-15 DIAGNOSIS — F331 Major depressive disorder, recurrent, moderate: Secondary | ICD-10-CM

## 2024-01-15 MED ORDER — ALPRAZOLAM 0.25 MG PO TABS: ORAL_TABLET | ORAL | 1 refills | Status: DC

## 2024-01-15 MED ORDER — TRAZODONE HCL 50 MG PO TABS: 50.0000 mg | ORAL_TABLET | Freq: Every day | ORAL | 1 refills | Status: DC

## 2024-01-15 MED ORDER — VILAZODONE HCL 40 MG PO TABS: 40.0000 mg | ORAL_TABLET | Freq: Every day | ORAL | 1 refills | Status: DC

## 2024-01-15 MED ORDER — ZOLPIDEM TARTRATE 10 MG PO TABS: 10.0000 mg | ORAL_TABLET | Freq: Every day | ORAL | 3 refills | Status: DC

## 2024-01-15 NOTE — Progress Notes (Signed)
 [facility]  Patient ID: Alexander Turner,  MRN: [id number]  PCP: Lenon Layman ORN, MD  Date of Evaluation: 01/15/2024 Time spent:30 minutes  Chief Complaint:  Chief Complaint   Depression; Anxiety; Follow-up; Patient Education; Medication Refill; Stress     HISTORY/CURRENT STATUS: HPI 67, 58 year old male reports to this office for follow up and medication management.  Collateral information should be considered reliable.  Smiling today, and very happy with his medication. Say he simply feels good.  Some work related stressors but managing ok for now. Says the stressors around him have softened some. He is requesting no medication changes today.  Patient reports his depression today at 1/10 and anxiety at 1/10.   Patient states that he currently feels safe.  Verbally contracted for safety with this Clinical research associate.  Reports no history of mania, no psychosis, no auditory or visual hallucinations.  No SI or HI.   Past psychiatric medication trials: Wellbutrin-Disturbing thoughts Lexapro-Report non effective, sexual side effects BuSpar-non effective Xanax      Individual Medical History/ Review of Systems: Changes? :No   Allergies: Doxycycline, Chlorhexidine, and Chlorhexidine gluconate  Current Medications:  Current Outpatient Medications:    albuterol (VENTOLIN HFA) 108 (90 Base) MCG/ACT inhaler, Inhale into the lungs. (Patient not taking: Reported on 01/14/2023), Disp: , Rfl:    ALPRAZolam  (XANAX ) 0.25 MG tablet, Take one tablet by mouth daily for severe anxiety, Disp: 20 tablet, Rfl: 1   Azelastine HCl 137 MCG/SPRAY SOLN, , Disp: , Rfl:    azithromycin  (ZITHROMAX  Z-PAK) 250 MG tablet, Take 2 tablets (500 mg) today, then 1 tablet (250 mg) for next 4 days. (Patient not taking: Reported on 01/14/2023), Disp: 6 tablet, Rfl: 0   brompheniramine-pseudoephedrine-DM (BROMFED DM) 30-2-10 MG/5ML syrup, Bromfed DM Take 10 mL (oral) every 6 hours PRN - Cough [id number] syrup every 6  hours oral No set duration recorded days active 2-30-10 mg/5 mL, Disp: , Rfl:    buPROPion (WELLBUTRIN XL) 300 MG 24 hr tablet, Take by mouth. (Patient not taking: Reported on 01/14/2023), Disp: , Rfl:    celecoxib (CELEBREX) 100 MG capsule, Take 100 mg by mouth 2 (two) times daily. (Patient not taking: Reported on 01/14/2023), Disp: , Rfl:    escitalopram (LEXAPRO) 10 MG tablet, , Disp: , Rfl:    fluticasone (FLONASE) 50 MCG/ACT nasal spray, Place 2 sprays into both nostrils daily., Disp: , Rfl:    HYDROcodone-acetaminophen (NORCO/VICODIN) 5-325 MG tablet, Take by mouth. (Patient not taking: Reported on 01/14/2023), Disp: , Rfl:    meloxicam (MOBIC) 15 MG tablet, Take 1 tablet every day by oral route. (Patient not taking: Reported on 01/14/2023), Disp: , Rfl:    mometasone (NASONEX) 50 MCG/ACT nasal spray, Place 2 sprays into the nose daily. (Patient not taking: Reported on 01/14/2023), Disp: , Rfl:    mupirocin ointment (BACTROBAN) 2 %, Apply topically 2 (two) times daily. (Patient not taking: Reported on 01/14/2023), Disp: , Rfl:    oseltamivir (TAMIFLU) 75 MG capsule, Tamiflu Take 1 capsule (oral) 2 times per day for 5 days [id number] capsule 2 times per day oral 5 days active 75 MG (Patient not taking: Reported on 01/14/2023), Disp: , Rfl:    predniSONE  (STERAPRED UNI-PAK 21 TAB) 10 MG (21) TBPK tablet, Take by mouth daily. Take 6 tabs by mouth daily for 1 day, then 5 tabs for 1 day, then 4 tabs for 1 day, then 3 tabs for 1 day, then 2 tabs for 1 day, then 1 tab by mouth  daily for 1 day (Patient not taking: Reported on 01/14/2023), Disp: 21 tablet, Rfl: 0   Ruxolitinib Phosphate (OPZELURA) 1.5 % CREA, , Disp: , Rfl:    traZODone  (DESYREL ) 50 MG tablet, Take 1-2 tablets (50-100 mg total) by mouth at bedtime., Disp: 180 tablet, Rfl: 1   Vilazodone  HCl (VIIBRYD ) 40 MG TABS, Take 1 tablet (40 mg total) by mouth daily., Disp: 90 tablet, Rfl: 1   zolpidem  (AMBIEN ) 10 MG tablet, Take 1 tablet (10 mg total) by  mouth at bedtime., Disp: 30 tablet, Rfl: 3 Medication Side Effects: none  Family Medical/ Social History: Changes? No  MENTAL HEALTH EXAM:  There were no vitals taken for this visit.There is no height or weight on file to calculate BMI.  General Appearance: Casual and Well Groomed  Eye Contact:  Good  Speech:  Clear and Coherent  Volume:  Normal  Mood:  NA  Affect:  Appropriate  Thought Process:  Coherent  Orientation:  Full (Time, Place, and Person)  Thought Content: Logical   Suicidal Thoughts:  No  Homicidal Thoughts:  No  Memory:  WNL  Judgement:  Good  Insight:  Good  Psychomotor Activity:  Normal  Concentration:  Concentration: Good  Recall:  Good  Fund of Knowledge: Good  Language: Good  Assets:  Desire for Improvement  ADL's:  Intact  Cognition: WNL  Prognosis:  Good    DIAGNOSES:    ICD-10-CM   1. Adjustment disorder with mixed anxiety and depressed mood  F43.23 Vilazodone  HCl (VIIBRYD ) 40 MG TABS    2. Generalized anxiety disorder  F41.1 ALPRAZolam  (XANAX ) 0.25 MG tablet    3. Insomnia due to other mental disorder  F51.05 traZODone  (DESYREL ) 50 MG tablet   F99 zolpidem  (AMBIEN ) 10 MG tablet    4. Moderate episode of recurrent major depressive disorder (HCC)  F33.1 Vilazodone  HCl (VIIBRYD ) 40 MG TABS      Receiving Psychotherapy: No    RECOMMENDATIONS:   Greater than 50% of 30  min  face to face time with patient was spent on counseling and coordination of care. We discussed  great improvement with his depression and anxiety to near zero levels.  Requesting no medication adjustments today.   It was recommended that he continue in psychotherapy.   We agreed today to:   Continue Viibryd   20 mg daily.  Must take with food. To continue trazodone  50 mg at bedtime for sleep Continue Ambien  10 mg as  needed. To continue, #20, Xanax  0.25 mg daily only for severe anxiety Will report worsening symptoms or side effects promptly Will follow-up in 3 month to  reassess or sooner if condition changes Provided emergency contact information number Discussed potential benefits, risk, and side effects of benzodiazepines to include potential risk of tolerance and dependence, as well as possible drowsiness.  Advised patient not to drive if experiencing drowsiness and to take lowest possible effective dose to minimize risk of dependence and tolerance.  Reviewed PDMP            Redell DELENA Pizza, NP

## 2024-01-25 ENCOUNTER — Ambulatory Visit: Admitting: Surgery

## 2024-01-25 ENCOUNTER — Encounter: Payer: Self-pay | Admitting: Surgery

## 2024-01-25 VITALS — BP 136/84 | HR 83 | Ht 70.0 in | Wt 177.0 lb

## 2024-01-25 DIAGNOSIS — D1722 Benign lipomatous neoplasm of skin and subcutaneous tissue of left arm: Secondary | ICD-10-CM

## 2024-01-25 DIAGNOSIS — D171 Benign lipomatous neoplasm of skin and subcutaneous tissue of trunk: Secondary | ICD-10-CM | POA: Diagnosis not present

## 2024-01-25 NOTE — Patient Instructions (Signed)
 Lipoma  A lipoma is a noncancerous (benign) tumor that is made up of fat cells. This is a very common type of soft-tissue growth. Lipomas are usually found under the skin (subcutaneous). They may occur in any tissue of the body that contains fat. Common areas for lipomas to appear include the back, arms, shoulders, buttocks, and thighs. Lipomas grow slowly, and they are usually painless. Most lipomas do not cause problems and do not require treatment. What are the causes? The cause of this condition is not known. What increases the risk? You are more likely to develop this condition if: You are 18-42 years old. You have a family history of lipomas. What are the signs or symptoms? A lipoma usually appears as a small, round bump under the skin. In most cases, the lump will: Feel soft or rubbery. Not cause pain or other symptoms. However, if a lipoma is located in an area where it pushes on nerves, it can become painful or cause other symptoms. How is this diagnosed? A lipoma can usually be diagnosed with a physical exam. You may also have tests to confirm the diagnosis and to rule out other conditions. Tests may include: Imaging tests, such as a CT scan or an MRI. Removal of a tissue sample to be looked at under a microscope (biopsy). How is this treated? Treatment for this condition depends on the size of the lipoma and whether it is causing any symptoms. For small lipomas that are not causing problems, no treatment is needed. If a lipoma is bigger or it causes problems, surgery may be done to remove the lipoma. Lipomas can also be removed to improve appearance. Most often, the procedure is done after applying a medicine that numbs the area (local anesthetic). Liposuction may be done to reduce the size of the lipoma before it is removed through surgery, or it may be done to remove the lipoma. Lipomas are removed with this method to limit incision size and scarring. A liposuction tube is  inserted through a small incision into the lipoma, and the contents of the lipoma are removed through the tube with suction. Follow these instructions at home: Watch your lipoma for any changes. Keep all follow-up visits. This is important. Where to find more information OrthoInfo: orthoinfo.aaos.org Contact a health care provider if: Your lipoma becomes larger or hard. Your lipoma becomes painful, red, or increasingly swollen. These could be signs of infection or a more serious condition. Get help right away if: You develop tingling or numbness in an area near the lipoma. This could indicate that the lipoma is causing nerve damage. Summary A lipoma is a noncancerous tumor that is made up of fat cells. Most lipomas do not cause problems and do not require treatment. If a lipoma is bigger or it causes problems, surgery may be done to remove the lipoma. Contact a health care provider if your lipoma becomes larger or hard, or if it becomes painful, red, or increasingly swollen. These could be signs of infection or a more serious condition. This information is not intended to replace advice given to you by your health care provider. Make sure you discuss any questions you have with your health care provider. Document Revised: 05/03/2021 Document Reviewed: 05/03/2021 Elsevier Patient Education  2024 [employer].

## 2024-01-25 NOTE — Progress Notes (Unsigned)
 01/25/2024  Reason for Visit: Lipomas of the left upper extremity and abdominal wall  History of Present Illness: Alexander Turner is a 58 y.o. male presenting for evaluation of multiple lipomas.  The patient is well-known to our practice and he has had multiple lipomas excised by me in the right upper extremity, left upper extremity, left thigh and hip areas as well as right hip and right lower quadrant and right chest wall.  There is happen over the course of 2 years between 2020 and 2021.  The patient presents today because he developed some new lipomas that are causing discomfort and would like them to be excised.  He reports having 2 lipomas in the left forearm, 1 lipoma in the left upper arm, and 1 lipoma in the anterior abdominal wall.  These cause discomfort particular when there is pressure on them.  Denies any drainage, overlying skin changes, or rapid growth of these masses..  Past Medical History: Past Medical History:  Diagnosis Date   Multiple lipomas    bilateral upper and lower extremities     Past Surgical History: Past Surgical History:  Procedure Laterality Date   COLONOSCOPY     KNEE SURGERY Right    LIPOMA EXCISION Right 02/08/2019   RUE x 3 lipomas   LIPOMA EXCISION Left 03/18/2019   LUE x 1 lipoma   NASAL ENDOSCOPY     TONSILLECTOMY      Home Medications: Prior to Admission medications   Medication Sig Start Date End Date Taking? Authorizing Provider  celecoxib (CELEBREX) 100 MG capsule Take 100 mg by mouth 2 (two) times daily. 01/17/20  Yes [provider]  mometasone (NASONEX) 50 MCG/ACT nasal spray Place 2 sprays into the nose daily. Patient not taking: Reported on 01/14/2023    [provider]  traZODone  (DESYREL ) 50 MG tablet Take 1-2 tablets (50-100 mg total) by mouth at bedtime. 01/15/24   Teresa Redell DELENA, NP  Vilazodone  HCl (VIIBRYD ) 40 MG TABS Take 1 tablet (40 mg total) by mouth daily. 01/15/24   Teresa Redell DELENA, NP  zolpidem  (AMBIEN )  10 MG tablet Take 1 tablet (10 mg total) by mouth at bedtime. 01/15/24   Teresa Redell DELENA, NP    Allergies: Allergies  Allergen Reactions   Doxycycline    Chlorhexidine Rash   Chlorhexidine Gluconate Rash    Social History:  reports that he has never smoked. He has never used smokeless tobacco. He reports current alcohol use. He reports that he does not use drugs.   Family History: Family History  Problem Relation Age of Onset   Anxiety disorder Mother    Depression Mother    Anxiety disorder Father    Depression Father     Review of Systems: Review of Systems  Constitutional:  Negative for chills and fever.  Respiratory:  Negative for shortness of breath.   Cardiovascular:  Negative for chest pain.  Gastrointestinal:  Negative for nausea and vomiting.  Skin:        Multiple lipomas of left upper extremity and anterior abdominal wall.    Physical Exam BP 136/84   Pulse 83   Ht 5' 10 (1.778 m)   Wt 177 lb (80.3 kg)   SpO2 97%   BMI 25.40 kg/m  CONSTITUTIONAL: No acute distress, well-nourished HEENT:  Normocephalic, atraumatic, extraocular motion intact. RESPIRATORY:  Normal respiratory effort without pathologic use of accessory muscles. CARDIOVASCULAR: Regular rhythm and rate MUSCULOSKELETAL:  Normal muscle strength and tone in all four  extremities.  No peripheral edema or cyanosis. SKIN: The patient has a 1.5 cm mass in the left upper arm dorsal aspect which is soft, somewhat mobile, consistent with lipoma.  He also has two 1.5 cm masses in the left forearm in the ventral aspect which are also soft, and somewhat mobile.  He also has a 2 cm mass in the epigastric aspect of the anterior abdominal wall which is also soft, somewhat mobile. NEUROLOGIC:  Motor and sensation is grossly normal.  Cranial nerves are grossly intact. PSYCH:  Alert and oriented to person, place and time. Affect is normal.  Laboratory Analysis: No results found for this or any previous visit (from  the past 24 hours).  Imaging: No results found.  Assessment and Plan: This is a 58 y.o. male with multiple lipomas.  - Discussed with patient the findings on exam showing 3 lipomas in the left upper extremity and 1 lipoma in the anterior abdominal wall.  These masses are consistent with lipomas that he has had previously in other areas.  Discussed with patient that we can offer excision of these just like before in the office's office procedures.  Discussed with him that we can try doing 2 in 1 session followed by the other 2.  He is in agreement with this. - We will schedule him for procedure on 02/01/2024.  All of his questions have been answered.  I spent 30 minutes dedicated to the care of this patient on the date of this encounter to include pre-visit review of records, face-to-face time with the patient discussing diagnosis and management, and any post-visit coordination of care.   Aloysius Sheree Plant, MD [facility]

## 2024-02-01 ENCOUNTER — Encounter: Payer: Self-pay | Admitting: Surgery

## 2024-02-01 ENCOUNTER — Ambulatory Visit: Admitting: Surgery

## 2024-02-01 VITALS — BP 136/77 | HR 85 | Temp 98.9°F | Wt 176.4 lb

## 2024-02-01 DIAGNOSIS — D1722 Benign lipomatous neoplasm of skin and subcutaneous tissue of left arm: Secondary | ICD-10-CM | POA: Diagnosis not present

## 2024-02-01 DIAGNOSIS — D171 Benign lipomatous neoplasm of skin and subcutaneous tissue of trunk: Secondary | ICD-10-CM | POA: Diagnosis not present

## 2024-02-01 HISTORY — PX: LIPOMA EXCISION: SHX5283

## 2024-02-01 NOTE — Progress Notes (Signed)
  Procedure Date:  02/01/2024  Pre-operative Diagnosis:  Lipoma of left upper arm and abdominal wall  Post-operative Diagnosis:  Lipoma of left upper arm (2 cm) and abdominal wall (2.5 cm)  Procedure:  Excision of lipoma of left upper arm and abdominal wall.  Surgeon:  Aloysius Sheree Plant, MD  Anesthesia:  7 ml of 1% lidocaine  Estimated Blood Loss:  5 ml  Specimens:   Left upper arm lipoma Abdominal wall lipoma  Complications:  None  Indications for Procedure:  This is a 58 y.o. male with diagnosis of a symptomatic left upper arm lipoma and abdominal wall lipoma.  The patient wishes to have this excised. The risks of bleeding, abscess or infection, injury to surrounding structures, and need for further procedures were all discussed with the patient and he was willing to proceed.  Description of Procedure: The patient was correctly identified at bedside.  The patient was placed supine.  Appropriate time-outs were performed.  We started with the left upper arm.  The patient's left upper arm was prepped and draped in usual sterile fashion.  Local anesthetic was infused intradermally.  A 2 cm incision was made over the lipoma, and scalpel was used to dissect down the skin and subcutaneous tissue.  Skin flaps were created sharply, and then the lipoma was excised intact.  It was sent off to pathology.  The cavity was then irrigated and hemostasis was assured with manual pressure.  The wound was then closed in two layers using 3-0 Vicryl and 4-0 Monocryl.  The incision was cleaned and sealed with DermaBond.  We then moved to the abdominal wall.  The patient's abdominal wall was prepped and draped in usual sterile fashion.  Local anesthetic was infused intradermally.  A 2.5 cm incision was made over the lipoma, and scalpel was used to dissect down the skin and subcutaneous tissue.  Skin flaps were created sharply, and then the lipoma was excised intact.  It was sent off to pathology.  The cavity was  then irrigated and hemostasis was assured with manual pressure.  The wound was then closed in two layers using 3-0 Vicryl and 4-0 Monocryl.  The incision was cleaned and sealed with DermaBond.  The patient tolerated the procedure well and all sharps were appropriately disposed of at the end of the case.  --Patient may shower tomorrow --May take ibuprofen or Tylenol for pain control. --Activity restrictions discussed. --Follow up next week.  Aloysius Sheree Plant, MD

## 2024-02-01 NOTE — Patient Instructions (Addendum)
 Please call and ask to speak with a nurse if you develop questions or concerns.    Today we have removed a Lipoma in our office. Please see information below regarding this type of tumor.  You are free to shower tomorrow. Do not scrub at the area.   You have glue on your skin and sutures under the skin. The glue will come off on it's own in 10-14 days. You may shower normally until this occurs but do not submerge.  Please use Tylenol or Ibuprofen for pain as needed. You may use ice to the area 3-4 times today and tomorrow for any achiness.   You may continue your regular activities right away but if you are having pain while doing something, stop what you are doing and try this activity once again in 3 days. Please call our office with any questions or concerns prior to your appointment.  Call to report any excessive bleeding, spreading redness, or increased pain.    Lipoma Removal Lipoma removal is a surgical procedure to remove a noncancerous (benign) tumor that is made up of fat cells (lipoma). Most lipomas are small and painless and do not require treatment. They can form in many areas of the body but are most common under the skin of the back, shoulders, arms, and thighs. You may need lipoma removal if you have a lipoma that is large, growing, or causing discomfort. Lipoma removal may also be done for cosmetic reasons. Tell a health care provider about: Any allergies you have. All medicines you are taking, including vitamins, herbs, eye drops, creams, and over-the-counter medicines. Any problems you or family members have had with anesthetic medicines. Any blood disorders you have. Any surgeries you have had. Any medical conditions you have. Whether you are pregnant or may be pregnant. What are the risks? Generally, this is a safe procedure. However, problems may occur, including: Infection. Bleeding. Allergic reactions to medicines. Damage to nerves or blood vessels near the  lipoma. Scarring.  Medicines Ask your health care provider about: Changing or stopping your regular medicines. This is especially important if you are taking diabetes medicines or blood thinners. Taking medicines such as aspirin and ibuprofen. These medicines can thin your blood. Do not take these medicines before your procedure if your health care provider instructs you not to. You may be given antibiotic medicine to help prevent infection. General instructions Ask your health care provider how your surgical site will be marked or identified. You will have a physical exam. Your health care provider will check the size of the lipoma and whether it can be moved easily.  What happens during the procedure? To reduce your risk of infection: Your health care team will wash or sanitize their hands. Your skin will be washed with surgical soap. You will be given the following: A medicine to numb the area (local anesthetic). An incision will be made over the lipoma or very near the lipoma. The incision may be made in a natural skin line or crease. Tissues, nerves, and blood vessels near the lipoma will be moved out of the way. The lipoma and the capsule that surrounds it will be separated from the surrounding tissues. The lipoma will be removed. The incision may be closed with stitches and surgical glue

## 2024-02-04 LAB — SURGICAL PATHOLOGY

## 2024-02-10 ENCOUNTER — Ambulatory Visit: Admitting: Psychology

## 2024-02-10 ENCOUNTER — Ambulatory Visit

## 2024-02-10 DIAGNOSIS — D171 Benign lipomatous neoplasm of skin and subcutaneous tissue of trunk: Secondary | ICD-10-CM

## 2024-02-10 DIAGNOSIS — Z09 Encounter for follow-up examination after completed treatment for conditions other than malignant neoplasm: Secondary | ICD-10-CM

## 2024-02-10 DIAGNOSIS — D1722 Benign lipomatous neoplasm of skin and subcutaneous tissue of left arm: Secondary | ICD-10-CM

## 2024-02-10 DIAGNOSIS — F4323 Adjustment disorder with mixed anxiety and depressed mood: Secondary | ICD-10-CM | POA: Diagnosis not present

## 2024-02-10 NOTE — Progress Notes (Signed)
 Wounds look great. Glue is coming off like expected. No swelling or discharge.

## 2024-02-10 NOTE — Progress Notes (Signed)
 [facility] Counselor/Therapist Progress Note  Patient ID: Alexander Turner, MRN: [id number],    Date: 02/10/2024  Time Spent: 45  mins; start time: 1300; stop time: 1345  Treatment Type: Individual Therapy  Reported Symptoms: Pt presents for the session via Careg[employer] video  He states that he is in his home with no one else present; he grants consent for the session and states that he understands the limits of virtual sessions.  I shared with pt that I am in my office with no one else present here.    Mental Status Exam: Appearance:  Casual     Behavior: Appropriate  Motor: Normal  Speech/Language:  Clear and Coherent  Affect: Appropriate  Mood: normal  Thought process: normal  Thought content:   WNL  Sensory/Perceptual disturbances:   WNL  Orientation: oriented to person, place, and time/date  Attention: Good  Concentration: Good  Memory: WNL  Fund of knowledge:  Good  Insight:   Good  Judgment:  Good  Impulse Control: Good   Risk Assessment: Danger to Self:  No Self-injurious Behavior: No Danger to Others: No Duty to Warn:no Physical Aggression / Violence:No  Access to Firearms a concern: No  Gang Involvement:No   Subjective: Pt shares that I have been fine since our last session.  I have my team's annual review with [employer], (the exec who has clearly had it out for pt).  Pt shares that Slater has been demanding more projects of his team that seem to show very little benefit for the company.  Pt also shares that [name] and his sister Rick and her husband Glendia).  [name] has learned that her mom has continued to funnel monies to [employer], and their kids.  [name] is disappointed by this situation and pt is very angry.  [name] paid for [employer] and Kim's daughter's wedding but did not offer to help with [name]'s wedding.  She paid for the other grand kids college educations but did not help pt's kids with theirs.  Encouraged pt to go with [name] to talk with Graylin  about what is going on in this situation.  Pt shares that their grandson, [name], and Redell are all doing well.  He is growing and has doubled his weight in the past 6 weeks.  Pt continues to do well with his medication.  Encouraged pt to continue to engage with his self care activities as intentionally as he can and we will meet in 4 wks for a follow up session.  Interventions: Cognitive Behavioral Therapy  Diagnosis:Adjustment disorder with mixed anxiety and depressed mood  Plan: Treatment Plan Strengths/Abilities:  Intelligent, Intuitive, Willing to participate in therapy Treatment Preferences:  Outpatient Individual Therapy Statement of Needs:  Patient is to use CBT, mindfulness and coping skills to help manage and/or decrease symptoms associated with their diagnosis. Symptoms:  Depressed/Irritable mood, worry, social withdrawal Problems Addressed:  Depressive thoughts, Sadness, Sleep issues, etc. Long Term Goals:  Pt to reduce overall level, frequency, and intensity of the feelings of depression/anxiety as evidenced by decreased irritability, negative self talk, and helpless feelings from 6 to 7 days/week to 0 to 1 days/week, per client report, for at least 3 consecutive months.  Progress: 20% Short Term Goals:  Pt to verbally express understanding of the relationship between feelings of depression/anxiety and their impact on thinking patterns and behaviors.  Pt to verbalize an understanding of the role that distorted thinking plays in creating fears, excessive worry, and ruminations.  Progress: 20%  Target Date:  07/28/2024 Frequency:  Bi-weekly Modality:  Cognitive Behavioral Therapy Interventions by Therapist:  Therapist will use CBT, Mindfulness exercises, Coping skills and Referrals, as needed by client. Client has verbally approved this treatment plan.  Francis KATHEE Macintosh, [facility]

## 2024-02-17 ENCOUNTER — Encounter: Payer: Self-pay | Admitting: Surgery

## 2024-02-17 ENCOUNTER — Ambulatory Visit (INDEPENDENT_AMBULATORY_CARE_PROVIDER_SITE_OTHER): Admitting: Surgery

## 2024-02-17 VITALS — BP 160/83 | HR 99 | Ht 70.0 in | Wt 178.0 lb

## 2024-02-17 DIAGNOSIS — K429 Umbilical hernia without obstruction or gangrene: Secondary | ICD-10-CM

## 2024-02-17 DIAGNOSIS — M6208 Separation of muscle (nontraumatic), other site: Secondary | ICD-10-CM | POA: Diagnosis not present

## 2024-02-17 HISTORY — DX: Separation of muscle (nontraumatic), other site: M62.08

## 2024-02-17 HISTORY — DX: Umbilical hernia without obstruction or gangrene: K42.9

## 2024-02-17 NOTE — Patient Instructions (Signed)
 You have requested for your Umbilical Hernia be repaired. This will be scheduled with Dr. Desiderio at [facility].   If you are on any injectable weight loss medication, you will need to stop taking your GLP-1 injectable (weight loss) medications 8 days before your surgery to avoid any complications with anesthesia.   Please see your (blue)pre-care sheet for information. Our surgery scheduler will call you to verify surgery date and to go over information.   You will need to arrange to be off work for 1-2 weeks but will have to have a lifting restriction of no more than 15 lbs for 6 weeks following your surgery. If you have FMLA or disability paperwork that needs filled out you may drop this off at our office or this can be faxed to [phone].     Umbilical Hernia, Adult A hernia is a bulge of tissue that pushes through an opening between muscles. An umbilical hernia happens in the abdomen, near the belly button (umbilicus). The hernia may contain tissues from the small intestine, large intestine, or fatty tissue covering the intestines (omentum). Umbilical hernias in adults tend to get worse over time, and they require surgical treatment. There are several types of umbilical hernias. You may have: A hernia located just above or below the umbilicus (indirect hernia). This is the most common type of umbilical hernia in adults. A hernia that forms through an opening formed by the umbilicus (direct hernia). A hernia that comes and goes (reducible hernia). A reducible hernia may be visible only when you strain, lift something heavy, or cough. This type of hernia can be pushed back into the abdomen (reduced). A hernia that traps abdominal tissue inside the hernia (incarcerated hernia). This type of hernia cannot be reduced. A hernia that cuts off blood flow to the tissues inside the hernia (strangulated hernia). The tissues can start to die if this happens. This type of hernia  requires emergency treatment.  What are the causes? An umbilical hernia happens when tissue inside the abdomen presses on a weak area of the abdominal muscles. What increases the risk? You may have a greater risk of this condition if you: Are obese. Have had several pregnancies. Have a buildup of fluid inside your abdomen (ascites). Have had surgery that weakens the abdominal muscles.  What are the signs or symptoms? The main symptom of this condition is a painless bulge at or near the belly button. A reducible hernia may be visible only when you strain, lift something heavy, or cough. Other symptoms may include: Dull pain. A feeling of pressure.  Symptoms of a strangulated hernia may include: Pain that gets increasingly worse. Nausea and vomiting. Pain when pressing on the hernia. Skin over the hernia becoming red or purple. Constipation. Blood in the stool.  How is this diagnosed? This condition may be diagnosed based on: A physical exam. You may be asked to cough or strain while standing. These actions increase the pressure inside your abdomen and force the hernia through the opening in your muscles. Your health care provider may try to reduce the hernia by pressing on it. Your symptoms and medical history.  How is this treated? Surgery is the only treatment for an umbilical hernia. Surgery for a strangulated hernia is done as soon as possible. If you have a small hernia that is not incarcerated, you may need to lose weight before having surgery. Follow these instructions at home: Lose weight, if told by your health care provider.  Do not try to push the hernia back in. Watch your hernia for any changes in color or size. Tell your health care provider if any changes occur. You may need to avoid activities that increase pressure on your hernia. Do not lift anything that is heavier than 10 lb (4.5 kg) until your health care provider says that this is safe. Take over-the-counter  and prescription medicines only as told by your health care provider. Keep all follow-up visits as told by your health care provider. This is important. Contact a health care provider if: Your hernia gets larger. Your hernia becomes painful. Get help right away if: You develop sudden, severe pain near the area of your hernia. You have pain as well as nausea or vomiting. You have pain and the skin over your hernia changes color. You develop a fever. This information is not intended to replace advice given to you by your health care provider. Make sure you discuss any questions you have with your health care provider. Document Released: 09/14/2015 Document Revised: 12/16/2015 Document Reviewed: 09/14/2015 Elsevier Interactive Patient Education  2018 [employer]   Diastasis Recti  Diastasis recti is a condition in which the muscles of the abdomen (rectus abdominis muscles) become thin and separate. The result is a wider space between the muscles of the right and left abdomen (abdominal muscles). This wider space between the muscles may cause a bulge in the middle of the abdomen. This bulge may be noticed when a person is straining or when he or she sits up after lying down. Diastasis recti can affect men and women. It is most common among pregnant women, babies, people with obesity, and people who have had abdominal surgery. Exercise or surgery may help correct this condition. What are the causes? Common causes of this condition include: Pregnancy. As the uterus grows in size, it puts pressure on the abdominal muscles, causing the muscles to separate. Obesity. Excess fat puts pressure on abdominal muscles. Weight lifting. Some exercises of the abdomen. Advanced age. Genetics. Having had surgery on the abdomen before. What increases the risk? This condition is more likely to develop in: Women. Newborns, especially newborns who are born early (prematurely). What are the signs or  symptoms? Common symptoms of this condition include: A bulge in the middle of your abdomen. You will notice it most when you sit up or strain. Pain in your low back, hips, or the area between your hip bones (pelvis). Constipation. Being unable to control when you urinate (urinary incontinence). Bloating. Poor posture. How is this diagnosed? This condition is diagnosed with a physical exam. During the exam, your health care provider will ask you to lie flat on your back and do a crunch or half sit-up. If you have diastasis recti, a bulge will appear lengthwise between your abdominal muscles in the center of your abdomen. Your health care provider will measure the gap between your muscles with one of the following: A medical device used to measure the space between two objects (caliper). A tape measure. CT scan. Ultrasound. Finger spaces. Your health care provider will measure the space using his or her fingers. How is this treated? If your muscle separation is not too large, you may not need treatment. However, if you are a woman who plans to become pregnant again, you should treat this condition before your next pregnancy. Treatment may include: Physical therapy exercises to strengthen and tighten your abdominal muscles. Lifestyle changes such as weight loss and exercise. Over-the-counter pain medicines  as needed. Surgery to correct the separation. Follow these instructions at home: Activity Return to your normal activities as told by your health care provider. Ask your health care provider what activities are safe for you. Do exercises as told by your health care provider. Make sure you are doing your exercises and movements correctly when lifting weights or doing exercises using your abdominal muscles or the muscles in the center of your body that give stability (core muscles). Proper form can help to prevent this condition from happening again. General instructions If you are overweight,  ask your health care provider for help with weight loss. Losing even a small amount of weight can help to improve your diastasis recti. Take over-the-counter or prescription medicines only as told by your health care provider. Do not strain. Straining can make the separation worse. Examples of straining include: Pushing hard to have a bowel movement, such as when you have constipation. Lifting heavy objects or lifting children. Standing up and sitting down. You may need to take these actions to prevent or treat constipation: Drink enough fluid to keep your urine pale yellow. Take over-the-counter or prescription medicines. Eat foods that are high in fiber, such as beans, whole grains, and fresh fruits and vegetables. Limit foods that are high in fat and processed sugars, such as fried or sweet foods. Keep all follow-up visits. This is important. Contact a health care provider if: You notice a new bulge in your abdomen. Get help right away if: You experience severe discomfort in your abdomen. You develop severe abdominal pain along with nausea, vomiting, or a fever. Summary Diastasis recti is a condition in which the muscles of the abdomen (rectus abdominismuscles) become thin and separate. You may notice a bulge in your abdomen because the space has widened between the muscles of the right and left abdomen. The most common symptom is a bulge in the middle of your abdomen. You will notice it most when you sit up or strain. This condition is diagnosed with a physical exam. If the muscle separation is not too big, you may not need treatment. Otherwise, you may need to do physical therapy or have surgery. This information is not intended to replace advice given to you by your health care provider. Make sure you discuss any questions you have with your health care provider. Document Revised: 12/16/2022 Document Reviewed: 12/16/2022 Elsevier Patient Education  2024 [employer].

## 2024-02-17 NOTE — Progress Notes (Signed)
 02/17/2024  History of Present Illness: Alexander Turner is a 58 y.o. male presenting for evaluation of an umbilical hernia.  The patient is well known to our office and has had excisions of multiple lipomas in both upper and lower extremities as well as abdominal wall.  His most recent excision was on 02/01/24.  He presents today because he wanted to address a possible umbilical hernia.  He has had this for a while and he also notices an area that bulges in the midline.  The hernia bulges when he's doing something strenuous, and reduces on its own.  Denies any worsening pain but endorses discomfort with bulging.    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   Umbilical hernia 02/17/2024     Past Surgical History: Past Surgical History:  Procedure Laterality Date   COLONOSCOPY     KNEE SURGERY Right    LIPOMA EXCISION Right 02/08/2019   RUE x 3 lipomas   LIPOMA EXCISION Left 03/18/2019   LUE x 1 lipoma   LIPOMA EXCISION Left 03/12/2020   Left thigh/hip x 5 lipomas   LIPOMA EXCISION Right 04/04/2020   Right hip, RLQ, right chest wall -- 3 total   LIPOMA EXCISION Left 02/01/2024   left upper arm, epigastric abdominal wall -- 2 total   NASAL ENDOSCOPY     TONSILLECTOMY      Home Medications: Prior to Admission medications   Medication Sig Start Date End Date Taking? Authorizing Provider  celecoxib (CELEBREX) 100 MG capsule Take 100 mg by mouth 2 (two) times daily. 01/17/20   [provider]  mometasone (NASONEX) 50 MCG/ACT nasal spray Place 2 sprays into the nose daily.    [provider]  traZODone  (DESYREL ) 50 MG tablet Take 1-2 tablets (50-100 mg total) by mouth at bedtime. 01/15/24   Teresa Redell DELENA, NP  Vilazodone  HCl (VIIBRYD ) 40 MG TABS Take 1 tablet (40 mg total) by mouth daily. 01/15/24   Teresa Redell DELENA, NP  zolpidem  (AMBIEN ) 10 MG tablet Take 1  tablet (10 mg total) by mouth at bedtime. 01/15/24   Teresa Redell DELENA, NP    Allergies: Allergies  Allergen Reactions   Doxycycline    Chlorhexidine Rash   Chlorhexidine Gluconate Rash    Review of Systems: Review of Systems  Constitutional:  Negative for chills and fever.  Respiratory:  Negative for shortness of breath.   Cardiovascular:  Negative for chest pain.  Gastrointestinal:  Negative for abdominal pain, nausea and vomiting.    Physical Exam BP (!) 160/83   Pulse 99   Ht 5' 10 (1.778 m)   Wt 178 lb (80.7 kg)   SpO2 98%   BMI 25.54 kg/m  CONSTITUTIONAL: No acute distress, well nourished. HEENT:  Normocephalic, atraumatic, extraocular motion intact. RESPIRATORY:  Lungs are clear, and breath sounds are equal bilaterally. Normal respiratory effort without pathologic use of accessory muscles. CARDIOVASCULAR: Heart is regular without murmurs, gallops, or rubs. GI: The abdomen is soft, non-distended, non-tender.  The patient has a 1-2 cm umbilical hernia which is reducible.  This is associated with a diastasis recti of the upper abdomen with a separation of about 3 cm.  SKIN:  LUE and abdominal wall incisions are healing well and are clean, dry, intact. NEUROLOGIC:  Motor and sensation is grossly normal.  Cranial nerves are grossly intact. PSYCH:  Alert and oriented  to person, place and time. Affect is normal.  Labs/Imaging: Labs from 04/03/23: Na 140, K 3.6, Cl 106, CO2 24, BUN 18, Cr 1.22.  WBC 7.1, Hgb 13.8, Hct 41.3, Plt 278.  Assessment and Plan: This is a 58 y.o. male with an umbilical hernia and diastasis recti.  --Discussed with the patient the findings on exam showing an umbilical hernia and an associated diastasis recti.  Discussed how both can form and happen.  Although diastasis is not a hernia itself, the fascia is weakened by the stretching and can increase the risk of a hernia.  As such, would recommend repairing not just the hernia but also plicating the  diastasis.  He is in agreement. --Discussed with him then the plan for a robotic assisted umbilical hernia repair with plication of diastasis recti.  Reviewed the surgery at length with him including the planned incisions, the risks of bleeding, infection, injury to surrounding structures, that this would be an outpatient surgery, post-operative activity restrictions, pain control, and he's willing to proceed. --Will schedule him for surgery on 03/31/24.  All of his questions have been answered.  I spent 30 minutes dedicated to the care of this patient on the date of this encounter to include pre-visit review of records, face-to-face time with the patient discussing diagnosis and management, and any post-visit coordination of care.   Aloysius Sheree Plant, MD [facility]

## 2024-02-19 ENCOUNTER — Telehealth: Payer: Self-pay | Admitting: Surgery

## 2024-02-19 NOTE — Telephone Encounter (Signed)
 Patient has been advised of Pre-Admission date/time, and Surgery date at [facility].  Surgery Date: 03/31/24 Preadmission Testing Date: Preadmissions to call patient for appointment.    Patient informed of the scheduling process and surgery information given at time of office visit.  Patient has been made aware to call [phone], between 1-3:00pm the day before surgery, to find out what time to arrive for surgery.

## 2024-03-09 ENCOUNTER — Encounter: Payer: Self-pay | Admitting: Surgery

## 2024-03-09 ENCOUNTER — Ambulatory Visit (INDEPENDENT_AMBULATORY_CARE_PROVIDER_SITE_OTHER): Admitting: Surgery

## 2024-03-09 VITALS — BP 138/83 | HR 80 | Temp 98.7°F | Ht 70.0 in | Wt 178.0 lb

## 2024-03-09 DIAGNOSIS — D1722 Benign lipomatous neoplasm of skin and subcutaneous tissue of left arm: Secondary | ICD-10-CM

## 2024-03-09 NOTE — Patient Instructions (Signed)
 Today we have removed a Lipoma in our office. Please see information below regarding this type of tumor.  You are free to shower 03/10/2024. Do not scrub at the area.   You have glue on your skin and sutures under the skin. The glue will come off on it's own in 10-14 days. You may shower normally until this occurs but do not submerge.  Please use Tylenol or Ibuprofen for pain as needed. You may use ice to the area 3-4 times today and tomorrow for any achiness.   We will see you back in 7-10 days to ensure that this has healed and to review the final pathology. Please see your appointment below. You may continue your regular activities right away but if you are having pain while doing something, stop what you are doing and try this activity once again in 3 days. Please call our office with any questions or concerns prior to your appointment.  Call to report any excessive bleeding, spreading redness, or increased pain.    Lipoma Removal Lipoma removal is a surgical procedure to remove a noncancerous (benign) tumor that is made up of fat cells (lipoma). Most lipomas are small and painless and do not require treatment. They can form in many areas of the body but are most common under the skin of the back, shoulders, arms, and thighs. You may need lipoma removal if you have a lipoma that is large, growing, or causing discomfort. Lipoma removal may also be done for cosmetic reasons. Tell a health care provider about: Any allergies you have. All medicines you are taking, including vitamins, herbs, eye drops, creams, and over-the-counter medicines. Any problems you or family members have had with anesthetic medicines. Any blood disorders you have. Any surgeries you have had. Any medical conditions you have. Whether you are pregnant or may be pregnant. What are the risks? Generally, this is a safe procedure. However, problems may occur, including: Infection. Bleeding. Allergic reactions to  medicines. Damage to nerves or blood vessels near the lipoma. Scarring.  Medicines Ask your health care provider about: Changing or stopping your regular medicines. This is especially important if you are taking diabetes medicines or blood thinners. Taking medicines such as aspirin and ibuprofen. These medicines can thin your blood. Do not take these medicines before your procedure if your health care provider instructs you not to. You may be given antibiotic medicine to help prevent infection. General instructions Ask your health care provider how your surgical site will be marked or identified. You will have a physical exam. Your health care provider will check the size of the lipoma and whether it can be moved easily.  What happens during the procedure? To reduce your risk of infection: Your health care team will wash or sanitize their hands. Your skin will be washed with surgical soap. You will be given the following: A medicine to numb the area (local anesthetic). An incision will be made over the lipoma or very near the lipoma. The incision may be made in a natural skin line or crease. Tissues, nerves, and blood vessels near the lipoma will be moved out of the way. The lipoma and the capsule that surrounds it will be separated from the surrounding tissues. The lipoma will be removed. The incision may be closed with stitches and surgical glue

## 2024-03-09 NOTE — Progress Notes (Signed)
  Procedure Date:  03/09/2024  Pre-operative Diagnosis:  Two lipomas of left forearm  Post-operative Diagnosis:  Two lipomas of left forearm, 1.8 cm (ventral) and 1.4 cm (dorsal)  Procedure:  Excision of left ventral and left dorsal forearm lipomas  Surgeon:  Aloysius Sheree Plant, MD  Anesthesia:  5 ml 1% lidocaine with epi  Estimated Blood Loss:  5 ml  Specimens:   Left ventral forearm lipoma Left dorsal forearm lipoma  Complications:  None  Indications for Procedure:  This is a 58 y.o. male with diagnosis of a symptomatic left forearm lipomas.  The patient wishes to have this excised. The risks of bleeding, abscess or infection, injury to surrounding structures, and need for further procedures were all discussed with the patient and he was willing to proceed.  Description of Procedure: The patient was correctly identified at bedside.  The patient was placed supine.  Appropriate time-outs were performed.  We started with the ventral lipoma.  The patient's left ventral forearm area  was prepped and draped in usual sterile fashion.  Local anesthetic was infused intradermally.  A 2 cm incision was made over the lipoma, and scalpel was used to dissect down the skin and subcutaneous tissue.  Skin flaps were created sharply, and then the lipoma was excised intact.  It measured 1.8 cm.  It was sent off to pathology.  The cavity was then irrigated and hemostasis was assured with manual pressure.  The wound was then closed in two layers using 3-0 Vicryl and 4-0 Monocryl.  The incision was cleaned and sealed with DermaBond.  We then turned to the dorsal lipoma.  The patient's left dorsal forearm area  was prepped and draped in usual sterile fashion.  Local anesthetic was infused intradermally.  A 1.8 cm incision was made over the lipoma, and scalpel was used to dissect down the skin and subcutaneous tissue.  Skin flaps were created sharply, and then the lipoma was excised intact.  It measured 1.4 cm.   It was sent off to pathology.  The cavity was then irrigated and hemostasis was assured with 3-0 Vicryl suture x 1.  The wound was then closed in two layers using 3-0 Vicryl and 4-0 Monocryl.  The incision was cleaned and sealed with DermaBond.  The patient tolerated the procedure well and all sharps were appropriately disposed of at the end of the case.  --Patient may shower tomorrow --May take Tylenol or ibuprofen for pain control. --Activity restrictions discussed --Follow up on 03/31/24 for his surgery.   Aloysius Sheree Plant, MD

## 2024-03-14 ENCOUNTER — Encounter: Payer: Self-pay | Admitting: Surgery

## 2024-03-14 ENCOUNTER — Ambulatory Visit: Admitting: Surgery

## 2024-03-14 VITALS — BP 161/84 | HR 74 | Temp 98.4°F | Ht 70.0 in | Wt 181.8 lb

## 2024-03-14 DIAGNOSIS — Z09 Encounter for follow-up examination after completed treatment for conditions other than malignant neoplasm: Secondary | ICD-10-CM

## 2024-03-14 DIAGNOSIS — R21 Rash and other nonspecific skin eruption: Secondary | ICD-10-CM

## 2024-03-14 DIAGNOSIS — D1722 Benign lipomatous neoplasm of skin and subcutaneous tissue of left arm: Secondary | ICD-10-CM

## 2024-03-14 LAB — SURGICAL PATHOLOGY

## 2024-03-14 MED ORDER — TRIAMCINOLONE ACETONIDE 0.5 % EX OINT: 1.0000 | TOPICAL_OINTMENT | Freq: Two times a day (BID) | CUTANEOUS | 0 refills | Status: DC

## 2024-03-14 NOTE — Progress Notes (Signed)
 03/14/2024  HPI: Alexander Turner is a 58 y.o. male s/p excision of 2 left forearm lipomas on 03/09/2024.  Patient messaged our office today because he developed a rash on 03/11/2024 in the left forearm as well as his left chest.  The patient reports itching and redness in both areas.  He sent pictures via MyChart.  Denies any drainage from the incisions.  He has tried applying triamcinolone  ointment which he was prescribed a few years back when he had an allergic reaction to chlorhexidine.  He reports that this has helped a little bit but reports that there is still itching and redness.  Vital signs: BP (!) 161/84   Pulse 74   Temp 98.4 F (36.9 C) (Oral)   Ht 5' 10 (1.778 m)   Wt 181 lb 12.8 oz (82.5 kg)   SpO2 97%   BMI 26.09 kg/m    Physical Exam: Constitutional: No acute distress Skin: The patient has a papular rash in the left forearm at the site of the 2 incisions and between the 2 incisions.  There is some hive-like particularly where the Dermabond was on each of the incisions but there is also a rash between the incisions where there was no Dermabond.  The area of the arm appears to be a little bit more edematous but there is no evidence of infection.  In the left chest, the patient also has a papular rash that extends over the left sided chest wall at different dermatome levels.       Assessment/Plan: This is a 58 y.o. male s/p excision of 2 left forearm lipomas.  - Discussed with patient that the rash over the left forearm could be related to an allergic reaction to Dermabond given how the areas are raised where the Dermabond used to be.  However this would not necessarily explain the edema or diffuse nature between the incisions.  The surgery was done using Betadine instead of chlorhexidine and he had Betadine and Dermabond for his prior lipoma removals in October 2025.  Is unclear why he would have an allergic reaction at this point.  Also discussed with him that this  would not explain the rash that he has in his chest as we did not do any procedures in that area.  I do not think that allergic reaction from Dermabond will spread from the forearm to the left chest with no Dermabond being on his chest. - Discussed with patient that he can try Benadryl ointment to help with the itching.  I will give him a new prescription for triamcinolone  given that the other 1 likely is expired.  However I recommended that he seek guidance from his PCP as this more widespread rash may not be fully explained by the procedure.  Would want to make sure this is not shingles.  Discussed with him that he may need an oral steroid course and unfortunate this is more beyond our scope of practice. - The patient has an upcoming robotic umbilical hernia repair with plication of diastases recti scheduled for 03/31/24.  Will have to see how he continues to do from the rash standpoint before we proceed with surgery as we would not want to have any increased risk of wound infection.  Discussed with him that the surgery could be postponed if needed be he should keep us  posted about it.   Aloysius Sheree Plant, MD  [facility]

## 2024-03-14 NOTE — Patient Instructions (Signed)
 Shingles  Shingles, or herpes zoster, is an infection. It gives you a skin rash and blisters. These infected areas may hurt a lot. Shingles only happens if: You've had chickenpox. You've been given a shot called a vaccine to protect you from getting chickenpox. Shingles is rare in this case. What are the causes? Shingles is caused by a germ called the varicella-zoster virus. This is the same germ that causes chickenpox. After you're exposed to the germ, it stays in your body but is dormant. This means it isn't active. Shingles happens if the germ becomes active again. This can happen years after you're first exposed to the germ. What increases the risk? You may be more likely to get shingles if: You're older than 60 years of age. You're under a lot of stress. You have a weak immune system. The immune system is your body's defense system. It may be weak if: You have human immunodeficiency virus (HIV). You have acquired immunodeficiency syndrome (AIDS). You have cancer. You take medicines that weaken your immune system. These include organ transplant medicines. What are the signs or symptoms? The first symptoms of shingles may be itching, tingling, or pain. Your skin may feel like it's burning. A few days or weeks later, you'll get a rash. Here's what you can expect: The rash is likely to be on one side of your body. The rash may be shaped like a belt or a band. Over time, it will turn into blisters filled with fluid. The blisters will break open and change into scabs. The scabs will dry up in about 2-3 weeks. You may also have: A fever. Chills. A headache. Nausea. How is this diagnosed? Shingles is diagnosed with a skin exam. A sample called a culture may be taken from one of your blisters and sent to a lab. This will show if you have shingles. How is this treated? The rash may last for several weeks. There's no cure for shingles, but your health care provider may give you medicines.  These medicines may: Help with pain. Help with itching. Help with irritation and swelling. Help you get better sooner. Help to prevent long-term problems. If the rash is on your face, you may need to see an eye doctor or an ear, nose, and throat (ENT) doctor. Follow these instructions at home: Medicines Take your medicines only as told by your provider. Put an anti-itch cream or numbing cream on the rash or blisters as told by your provider. Relieving itching and discomfort  To help with itching: Put cold, wet cloths called cold compresses on the rash or blisters. Take a cool bath. Try adding baking soda or dry oatmeal to the water. Do not bathe in hot water. Use calamine lotion on the rash or blisters. You can get this type of lotion at the store. Blister and rash care Keep your rash covered with a loose bandage. Wear loose clothes that don't rub on your rash. Take care of your rash as told by your provider. Make sure you: Wash your hands with soap and water for at least 20 seconds before and after you change your bandage. If you can't use soap and water, use hand sanitizer. Keep your rash and blisters clean by washing them with mild soap and cool water. Change your bandage. Check your rash every day for signs of infection. Check for: More redness, swelling, or pain. Fluid or blood. Warmth. Pus or a bad smell. Do not scratch your rash. Do not pick at your  blisters. To help you not scratch: Keep your fingernails clean and cut short. Try to wear gloves or mittens when you sleep. General instructions Rest. Wash your hands often with soap and water for at least 20 seconds. If you can't use soap and water, use hand sanitizer. Washing your hands lowers your chance of getting a skin infection. Your infection can cause chickenpox in others. If you have blisters that aren't scabs yet, stay away from: Babies. Pregnant people. Children who have eczema. Older people who have organ  transplants. People who have a long-term, or chronic, illness. Anyone who hasn't had chickenpox before. Anyone who hasn't gotten the chickenpox vaccine. How is this prevented? Vaccines are the best way to prevent you from getting chickenpox or shingles. Talk with your provider about getting these shots. Where to find more information Centers for Disease Control and Prevention (CDC): [employer].no Contact a health care provider if: Your pain doesn't get better with medicine. Your pain doesn't get better after the rash heals. You have any signs of infection around the rash. Your rash or blisters get worse. You have a fever or chills. Get help right away if: The rash is on your face or nose. You have pain in your face or by your eye. You lose feeling on one side of your face. You have trouble seeing. You have ear pain or ringing in your ear. This information is not intended to replace advice given to you by your health care provider. Make sure you discuss any questions you have with your health care provider. Document Revised: 01/15/2023 Document Reviewed: 05/30/2022 Elsevier Patient Education  2024 [employer].

## 2024-03-17 ENCOUNTER — Telehealth: Payer: Self-pay | Admitting: Behavioral Health

## 2024-03-17 ENCOUNTER — Ambulatory Visit: Admitting: Psychology

## 2024-03-17 NOTE — Telephone Encounter (Signed)
 Tee called and said that he has initiated short term disability as of today. He was told to give brian a heads up

## 2024-03-18 ENCOUNTER — Inpatient Hospital Stay: Admission: RE | Admit: 2024-03-18 | Source: Ambulatory Visit

## 2024-03-18 ENCOUNTER — Ambulatory Visit (INDEPENDENT_AMBULATORY_CARE_PROVIDER_SITE_OTHER): Admitting: Psychology

## 2024-03-18 DIAGNOSIS — F4323 Adjustment disorder with mixed anxiety and depressed mood: Secondary | ICD-10-CM

## 2024-03-18 HISTORY — DX: Left upper quadrant pain: R10.12

## 2024-03-18 NOTE — Progress Notes (Signed)
 [facility] Counselor/Therapist Progress Note  Patient ID: Alexander Turner, MRN: [id number],    Date: 03/18/2024  Time Spent: 45  mins; start time: 1000; stop time: 1045  Treatment Type: Individual Therapy  Reported Symptoms: Pt presents for the session via Caregility video  He states that he is in his home with no one else present; he grants consent for the session and states that he understands the limits of virtual sessions.  I shared with pt that I am in my office with no one else present here.    Mental Status Exam: Appearance:  Casual     Behavior: Appropriate  Motor: Normal  Speech/Language:  Clear and Coherent  Affect: Appropriate  Mood: normal  Thought process: normal  Thought content:   WNL  Sensory/Perceptual disturbances:   WNL  Orientation: oriented to person, place, and time/date  Attention: Good  Concentration: Good  Memory: WNL  Fund of knowledge:  Good  Insight:   Good  Judgment:  Good  Impulse Control: Good   Risk Assessment: Danger to Self:  No Self-injurious Behavior: No Danger to Others: No Duty to Warn:no Physical Aggression / Violence:No  Access to Firearms a concern: No  Gang Involvement:No   Notified  Subjective: Pt shares that I have been busy since our last session.  I have hernia surgery scheduled on 12/4.  I have applied for a STD leave of absence due to the stress of dealing with my job.  Pt shares that he and Alexander Turner recently went to [facility] for her nephew's wedding and pt enjoyed seeing Alexander Turner and their grandson at the wedding.  Alexander Turner has been having further conversations with her mom Alexander Turner) about finances and creating greater fairness for pt's kids as related to [employer] and [employer].  Pt shares that he and Alexander Turner are hosting everyone for Thanksgiving and then they will go to [place] on Saturday for the annual flotilla.  Pt is having his hernia surgery on 12/4 and it will likely be a 6 wk recovery time.  Pt is using deep  breathing to try to keep himself calm with the stress of work and the disability claim.  Pt shares that Alexander Turner is still working and pt is pleased with that.  They are trying to work with him to continue to develop a sense of independence for him.  Pt continues to do well with his medication.  Encouraged pt to continue to engage with his self care activities as intentionally as he can and we will meet in 4 wks for a follow up session.  Interventions: Cognitive Behavioral Therapy  Diagnosis:Adjustment disorder with mixed anxiety and depressed mood  Plan: Treatment Plan Strengths/Abilities:  Intelligent, Intuitive, Willing to participate in therapy Treatment Preferences:  Outpatient Individual Therapy Statement of Needs:  Patient is to use CBT, mindfulness and coping skills to help manage and/or decrease symptoms associated with their diagnosis. Symptoms:  Depressed/Irritable mood, worry, social withdrawal Problems Addressed:  Depressive thoughts, Sadness, Sleep issues, etc. Long Term Goals:  Pt to reduce overall level, frequency, and intensity of the feelings of depression/anxiety as evidenced by decreased irritability, negative self talk, and helpless feelings from 6 to 7 days/week to 0 to 1 days/week, per client report, for at least 3 consecutive months.  Progress: 20% Short Term Goals:  Pt to verbally express understanding of the relationship between feelings of depression/anxiety and their impact on thinking patterns and behaviors.  Pt to verbalize an understanding of the role that distorted thinking  plays in creating fears, excessive worry, and ruminations.  Progress: 20% Target Date:  07/28/2024 Frequency:  Bi-weekly Modality:  Cognitive Behavioral Therapy Interventions by Therapist:  Therapist will use CBT, Mindfulness exercises, Coping skills and Referrals, as needed by client. Client has verbally approved this treatment plan.  Francis KATHEE Macintosh, [facility]

## 2024-03-18 NOTE — Telephone Encounter (Signed)
 Redell is aware of request

## 2024-03-22 ENCOUNTER — Telehealth: Payer: Self-pay | Admitting: Surgery

## 2024-03-22 NOTE — Telephone Encounter (Signed)
 Updated information regarding rescheduled surgery at patient request.   Patient has been advised of Pre-Admission date/time, and Surgery date at [facility].  Surgery Date: 04/26/24 Preadmission Testing Date: Preadmissions to call with new appointment.  Patient informed of the scheduling process and surgery information given at time of office visit.  Patient has been made aware to call [phone], between 1-3:00pm the day before surgery, to find out what time to arrive for surgery.

## 2024-03-28 ENCOUNTER — Inpatient Hospital Stay
Admission: RE | Admit: 2024-03-28 | Discharge: 2024-03-28 | Disposition: A | Discharge status: Home / Self Care | Source: Ambulatory Visit

## 2024-03-28 HISTORY — DX: Personal history of diseases of the blood and blood-forming organs and certain disorders involving the immune mechanism: Z86.2

## 2024-03-28 HISTORY — DX: Pure hypercholesterolemia, unspecified: E78.00

## 2024-03-28 NOTE — Patient Instructions (Signed)
 Your procedure is scheduled on: Thursday 03/31/24 Report to the Registration Desk on the 1st floor of the [facility]. To find out your arrival time, please call [phone] between 1PM - 3PM on: Wednesday 03/30/24 If your arrival time is 6:00 am, do not arrive before that time as the [facility] entrance doors do not open until 6:00 am.  REMEMBER: Instructions that are not followed completely may result in serious medical risk, up to and including death; or upon the discretion of your surgeon and anesthesiologist your surgery may need to be rescheduled.  Do not eat food after midnight the night before surgery.  No gum chewing or hard candies.  You may however, drink CLEAR liquids up to 2 hours before you are scheduled to arrive for your surgery. Do not drink anything within 2 hours of your scheduled arrival time.  Clear liquids include: - water  - apple juice without pulp - gatorade (not RED colors) - black coffee or tea (Do NOT add milk or creamers to the coffee or tea) Do NOT drink anything that is not on this list.   One week prior to surgery: Stop Anti-inflammatories (NSAIDS) such as Advil, Aleve, Ibuprofen, Motrin, Naproxen, Naprosyn and Aspirin based products such as Excedrin, Goody's Powder, BC Powder. Stop ANY OVER THE COUNTER supplements until after surgery.  You may however, continue to take Tylenol if needed for pain up until the day of surgery.  Stop tadalafil (CIALIS) 20 MG 2 days prior to surgery (Do not take after Monday 03/28/24)   Continue taking all of your other prescription medications up until the day of surgery.  ON THE DAY OF SURGERY ONLY TAKE THESE MEDICATIONS WITH SIPS OF WATER:  ALPRAZolam  (XANAX ) 0.25 MG  Vilazodone  HCl (VIIBRYD ) 40 MG    No Alcohol for 24 hours before or after surgery.  No Smoking including e-cigarettes for 24 hours before surgery.  No chewable tobacco products for at least 6 hours before surgery.  No nicotine patches on the day  of surgery.  Do not use any recreational drugs for at least a week (preferably 2 weeks) before your surgery.  Please be advised that the combination of cocaine and anesthesia may have negative outcomes, up to and including death. If you test positive for cocaine, your surgery will be cancelled.  On the morning of surgery brush your teeth with toothpaste and water, you may rinse your mouth with mouthwash if you wish. Do not swallow any toothpaste or mouthwash.  Use CHG Soap or wipes as directed on instruction sheet.  Do not wear jewelry, make-up, hairpins, clips or nail polish.  For welded (permanent) jewelry: bracelets, anklets, waist bands, etc.  Please have this removed prior to surgery.  If it is not removed, there is a chance that hospital personnel will need to cut it off on the day of surgery.  Do not wear lotions, powders, or perfumes.   Do not shave body hair from the neck down 48 hours before surgery.  Contact lenses, hearing aids and dentures may not be worn into surgery.  Do not bring valuables to the hospital. [facility] is not responsible for any missing/lost belongings or valuables.   Bring your C-PAP to the hospital in case you may have to spend the night.   Notify your doctor if there is any change in your medical condition (cold, fever, infection).  Wear comfortable clothing (specific to your surgery type) to the hospital.  After surgery, you can help prevent lung  complications by doing breathing exercises.  Take deep breaths and cough every 1-2 hours. Your doctor may order a device called an Incentive Spirometer to help you take deep breaths. When coughing or sneezing, hold a pillow firmly against your incision with both hands. This is called "splinting." Doing this helps protect your incision. It also decreases belly discomfort.  If you are being admitted to the hospital overnight, leave your suitcase in the car. After surgery it may be brought to your  room.  In case of increased patient census, it may be necessary for you, the patient, to continue your postoperative care in the Same Day Surgery department.  If you are being discharged the day of surgery, you will not be allowed to drive home. You will need a responsible individual to drive you home and stay with you for 24 hours after surgery.   If you are taking public transportation, you will need to have a responsible individual with you.  Please call the Pre-admissions Testing Dept. at [phone] if you have any questions about these instructions.  Surgery Visitation Policy:  Patients having surgery or a procedure may have two visitors.  Children under the age of 55 must have an adult with them who is not the patient.  Inpatient Visitation:    Visiting hours are 7 a.m. to 8 p.m. Up to four visitors are allowed at one time in a patient room. The visitors may rotate out with other people during the day.  One visitor age 68 or older may stay with the patient overnight and must be in the room by 8 p.m.   Merchandiser, Retail to address health-related social needs:  [email or web address]

## 2024-03-31 ENCOUNTER — Ambulatory Visit: Admitting: Behavioral Health

## 2024-04-04 ENCOUNTER — Ambulatory Visit: Admitting: Behavioral Health

## 2024-04-07 ENCOUNTER — Encounter: Payer: Self-pay | Admitting: Behavioral Health

## 2024-04-07 ENCOUNTER — Ambulatory Visit: Admitting: Behavioral Health

## 2024-04-07 DIAGNOSIS — Z638 Other specified problems related to primary support group: Secondary | ICD-10-CM | POA: Diagnosis not present

## 2024-04-07 DIAGNOSIS — F99 Mental disorder, not otherwise specified: Secondary | ICD-10-CM

## 2024-04-07 DIAGNOSIS — F331 Major depressive disorder, recurrent, moderate: Secondary | ICD-10-CM

## 2024-04-07 DIAGNOSIS — F4323 Adjustment disorder with mixed anxiety and depressed mood: Secondary | ICD-10-CM | POA: Diagnosis not present

## 2024-04-07 DIAGNOSIS — F411 Generalized anxiety disorder: Secondary | ICD-10-CM | POA: Diagnosis not present

## 2024-04-07 DIAGNOSIS — F5105 Insomnia due to other mental disorder: Secondary | ICD-10-CM

## 2024-04-07 MED ORDER — VILAZODONE HCL 40 MG PO TABS: 40.0000 mg | ORAL_TABLET | Freq: Every day | ORAL | 1 refills | Status: AC

## 2024-04-07 MED ORDER — ALPRAZOLAM 0.25 MG PO TABS: 0.2500 mg | ORAL_TABLET | Freq: Every day | ORAL | 3 refills | Status: AC

## 2024-04-07 MED ORDER — ZOLPIDEM TARTRATE 10 MG PO TABS: 10.0000 mg | ORAL_TABLET | Freq: Every day | ORAL | 1 refills | Status: AC

## 2024-04-07 MED ORDER — TRAZODONE HCL 50 MG PO TABS: 50.0000 mg | ORAL_TABLET | Freq: Every day | ORAL | 1 refills | Status: AC

## 2024-04-07 NOTE — Progress Notes (Signed)
 [facility]  Patient ID: Alexander Turner,  MRN: [id number]  PCP: Lenon Layman ORN, MD  Date of Evaluation: 04/07/2024 Time spent:30 minutes  Chief Complaint:  Chief Complaint   Depression; Anxiety; Follow-up; Stress; Patient Education     HISTORY/CURRENT STATUS: HPI 58, 58 year old male reports to this office for follow up and medication management.  Patient appears distressed and anxious today.  Says that he lost his job recently and is trying to recover.  Patient states that his workplace became toxic over the last few months.  Feel that he was terminated unjustly.  He reports his depression today at 4/10 and anxiety at 5/10.   Patient states that he currently feels safe.  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  Reports no history of mania, no psychosis, no auditory or visual hallucinations.  No SI or HI.   Past psychiatric medication trials: Wellbutrin-Disturbing thoughts Lexapro-Report non effective, sexual side effects BuSpar-non effective Xanax  Individual Medical History/ Review of Systems: Changes? :No   Allergies: Chlorhexidine, Cyanoacrylate, and Doxycycline  Current Medications: Current Medications[1] Medication Side Effects: none  Family Medical/ Social History: Changes? No  MENTAL HEALTH EXAM:  There were no vitals taken for this visit.There is no height or weight on file to calculate BMI.  General Appearance: Casual, Neat, and Well Groomed  Eye Contact:  Good  Speech:  Clear and Coherent  Volume:  Normal  Mood:  Anxious, Depressed, and Dysphoric  Affect:  Congruent, Depressed, and Anxious  Thought Process:  Coherent  Orientation:  Full (Time, Place, and Person)  Thought Content: Logical   Suicidal Thoughts:  No  Homicidal Thoughts:  No  Memory:  WNL  Judgement:  Good  Insight:  Good  Psychomotor Activity:  Normal  Concentration:  Concentration: Good  Recall:  Good  Fund of Knowledge: Good  Language: Good  Assets:   Desire for Improvement  ADL's:  Intact  Cognition: WNL  Prognosis:  Good    DIAGNOSES:    ICD-10-CM   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 Adjustment disorder with mixed anxiety and depressed mood  F43.23 Vilazodone  HCl (VIIBRYD ) 40 MG TABS    ALPRAZolam  (XANAX ) 0.25 MG tablet    3. Moderate episode of recurrent major depressive disorder (HCC)  F33.1 Vilazodone  HCl (VIIBRYD ) 40 MG TABS      Receiving Psychotherapy: No    RECOMMENDATIONS:  Greater than 50% of 30  min  face to face time with patient was spent on counseling and coordination of care.  We discussed his recent decline with anxiety and depression over the last few months due to increase stressors in the workplace.  We also discussed healthy coping mechanisms such as staying busy and productive.  Working out regularly.  For now he is requesting no medication changes or adjustments today.   We agreed today to:   Continue Viibryd   20 mg daily.  Must take with food. To continue trazodone  50 mg at bedtime for sleep Continue Ambien  10 mg as  needed. To continue, #20, Xanax  0.25 mg daily only for severe anxiety Will report worsening symptoms or side effects promptly Will follow-up in 2 month to reassess or sooner if condition changes Provided emergency contact information number Discussed potential benefits, risk, and side effects of benzodiazepines to include potential risk of tolerance and dependence, as well as possible drowsiness.  Advised patient not to  drive if experiencing drowsiness and to take lowest possible effective dose to minimize risk of dependence and tolerance.  Reviewed PDMP    Redell DELENA Pizza, NP      [1]  Current Outpatient Medications:    ALPRAZolam  (XANAX ) 0.25 MG tablet, Take 1 tablet (0.25 mg total) by mouth daily., Disp: 30 tablet, Rfl: 3   Cholecalciferol 20 MCG (800 UNIT) TABS, Take 1 tablet by mouth 3 (three)  times a week., Disp: , Rfl:    mometasone (NASONEX) 50 MCG/ACT nasal spray, Place 2 sprays into the nose daily., Disp: , Rfl:    predniSONE  (DELTASONE ) 10 MG tablet, Take 10 mg by mouth See admin instructions. Taper, Disp: , Rfl:    tadalafil (CIALIS) 20 MG tablet, Take 20 mg by mouth daily as needed for erectile dysfunction., Disp: , Rfl:    traZODone  (DESYREL ) 50 MG tablet, Take 1-2 tablets (50-100 mg total) by mouth at bedtime., Disp: 180 tablet, Rfl: 1   triamcinolone  ointment (KENALOG ) 0.5 %, Apply 1 Application topically 2 (two) times daily., Disp: 30 g, Rfl: 0   Vilazodone  HCl (VIIBRYD ) 40 MG TABS, Take 1 tablet (40 mg total) by mouth daily., Disp: 90 tablet, Rfl: 1   zolpidem  (AMBIEN ) 10 MG tablet, Take 1 tablet (10 mg total) by mouth at bedtime., Disp: 90 tablet, Rfl: 1

## 2024-04-08 ENCOUNTER — Ambulatory Visit: Admitting: Behavioral Health

## 2024-04-13 ENCOUNTER — Ambulatory Visit: Admitting: Psychology

## 2024-04-13 DIAGNOSIS — F4323 Adjustment disorder with mixed anxiety and depressed mood: Secondary | ICD-10-CM

## 2024-04-13 DIAGNOSIS — Z0289 Encounter for other administrative examinations: Secondary | ICD-10-CM

## 2024-04-13 NOTE — Progress Notes (Signed)
  [facility] Counselor/Therapist Progress Note  Patient ID: Alexander Turner, MRN: [id number],    Date: 04/13/2024  Time Spent: 35  mins; start time: 1000; stop time: 1035  Treatment Type: Individual Therapy  Reported Symptoms: Pt presents for the session via Caregility video  He states that he is in his car with no one else present; he grants consent for the session and states that he understands the limits of virtual sessions.  I shared with pt that I am in my office with no one else present here.    Mental Status Exam: Appearance:  Casual     Behavior: Appropriate  Motor: Normal  Speech/Language:  Clear and Coherent  Affect: Appropriate  Mood: normal  Thought process: normal  Thought content:   WNL  Sensory/Perceptual disturbances:   WNL  Orientation: oriented to person, place, and time/date  Attention: Good  Concentration: Good  Memory: WNL  Fund of knowledge:  Good  Insight:   Good  Judgment:  Good  Impulse Control: Good   Risk Assessment: Danger to Self:  No Self-injurious Behavior: No Danger to Others: No Duty to Warn:no Physical Aggression / Violence:No  Access to Firearms a concern: No  Gang Involvement:No   Notified of retirement  Subjective: Pt shares that I have been busy since our last session.  [name] and I have been to [employer] to attend the company Christmas party with her mom.  Today is [name]'s 25th birthday and we are going to dinner with him tonight.  We are going to [facility] tomorrow with another couple until Sunday and we are looking forward to that trip.  My initial request for disability was denied because they had not gotten paperwork back from [facility].  I did get a call from HR last week and we had a conference call with the whole department and they advised us  that the whole department was let go, effective 04/06/24 (immediately).  They are honoring my stock options for January and our bonuses for the year and I feel really good  about that.  I am ready for this chapter to close.  Pt shares that he is happy to have this work situation behind him and he is meeting with his attorney this afternoon to make sure that he does not need to further negotiate.  [name] has been talking with her mom about her mom being more generous with her family as she has been with the sister's family over the years.  Her mom is giving financial gifts to [name], pt, and their kids at the end of the year which they appreciate.  Pt has moved his hernia surgery on 12/30 now.  They are going to [place] to see Sherrilee Pride, and their grandson for Christmas this year as well.  Pt shares that [name] is still working and pt is pleased with that.  They are trying to work with him to continue to develop a sense of independence for him.  Pt continues to do well with his medication.  Encouraged pt to continue to engage with his self care activities as intentionally as he can and we will meet in 4 wks for a follow up session.  Interventions: Cognitive Behavioral Therapy  Diagnosis:Adjustment disorder with mixed anxiety and depressed mood  Plan: Treatment Plan Strengths/Abilities:  Intelligent, Intuitive, Willing to participate in therapy Treatment Preferences:  Outpatient Individual Therapy Statement of Needs:  Patient is to use CBT, mindfulness and coping skills to help manage and/or decrease symptoms associated  with their diagnosis. Symptoms:  Depressed/Irritable mood, worry, social withdrawal Problems Addressed:  Depressive thoughts, Sadness, Sleep issues, etc. Long Term Goals:  Pt to reduce overall level, frequency, and intensity of the feelings of depression/anxiety as evidenced by decreased irritability, negative self talk, and helpless feelings from 6 to 7 days/week to 0 to 1 days/week, per client report, for at least 3 consecutive months.  Progress: 20% Short Term Goals:  Pt to verbally express understanding of the relationship between feelings of  depression/anxiety and their impact on thinking patterns and behaviors.  Pt to verbalize an understanding of the role that distorted thinking plays in creating fears, excessive worry, and ruminations.  Progress: 20% Target Date:  07/28/2024 Frequency:  Bi-weekly Modality:  Cognitive Behavioral Therapy Interventions by Therapist:  Therapist will use CBT, Mindfulness exercises, Coping skills and Referrals, as needed by client. Client has verbally approved this treatment plan.  Francis KATHEE Macintosh, [facility]

## 2024-04-19 ENCOUNTER — Other Ambulatory Visit: Payer: Self-pay

## 2024-04-19 ENCOUNTER — Encounter
Admission: RE | Admit: 2024-04-19 | Discharge: 2024-04-19 | Disposition: A | Discharge status: Home / Self Care | Source: Ambulatory Visit | Attending: Surgery | Admitting: Surgery

## 2024-04-19 HISTORY — DX: Prediabetes: R73.03

## 2024-04-19 HISTORY — DX: Gastro-esophageal reflux disease without esophagitis: K21.9

## 2024-04-19 HISTORY — DX: Anxiety disorder, unspecified: F41.9

## 2024-04-19 NOTE — Patient Instructions (Addendum)
 Your procedure is scheduled on:04-26-24 Tuesday Report to the Registration Desk on the 1st floor of the [facility].Then proceed to the 2nd floor Surgery Desk To find out your arrival time, please call [phone] between 1PM - 3PM on:04-25-24 Monday If your arrival time is 6:00 am, do not arrive before that time as the [facility] entrance doors do not open until 6:00 am.  REMEMBER: Instructions that are not followed completely may result in serious medical risk, up to and including death; or upon the discretion of your surgeon and anesthesiologist your surgery may need to be rescheduled.  Do not eat food after midnight the night before surgery.  No gum chewing or hard candies.  You may however, drink CLEAR liquids up to 2 hours before you are scheduled to arrive for your surgery. Do not drink anything within 2 hours of your scheduled arrival time.  Clear liquids include: - water  - apple juice without pulp - gatorade (not RED colors) - black coffee or tea (Do NOT add milk or creamers to the coffee or tea) Do NOT drink anything that is not on this list.  One week prior to surgery:Stop NOW (04-19-24) Tuesday Stop Anti-inflammatories (NSAIDS) such as Advil, Aleve, Ibuprofen, Motrin, Naproxen, Naprosyn and Aspirin based products such as Excedrin, Goody's Powder, BC Powder. Stop ANY OVER THE COUNTER supplements until after surgery (Vitamin D)  You may however, continue to take Tylenol  if needed for pain up until the day of surgery.  Stop tadalafil (CIALIS) 2 days prior to surgery-Last dose will be on 04-23-24 Saturday  Continue taking all of your other prescription medications up until the day of surgery.  ON THE DAY OF SURGERY ONLY TAKE THESE MEDICATIONS WITH SIPS OF WATER: -Vilazodone  HCl (VIIBRYD )  -You may take ALPRAZolam  (XANAX ) if needed for anxiety  No Alcohol for 24 hours before or after surgery.  No Smoking including e-cigarettes for 24 hours before surgery.  No  chewable tobacco products for at least 6 hours before surgery.  No nicotine patches on the day of surgery.  Do not use any recreational drugs for at least a week (preferably 2 weeks) before your surgery.  Please be advised that the combination of cocaine and anesthesia may have negative outcomes, up to and including death. If you test positive for cocaine, your surgery will be cancelled.  On the morning of surgery brush your teeth with toothpaste and water, you may rinse your mouth with mouthwash if you wish. Do not swallow any toothpaste or mouthwash.  Do not wear jewelry, make-up, hairpins, clips or nail polish.  For welded (permanent) jewelry: bracelets, anklets, waist bands, etc.  Please have this removed prior to surgery.  If it is not removed, there is a chance that hospital personnel will need to cut it off on the day of surgery.  Do not wear lotions, powders, or perfumes.   Do not shave body hair from the neck down 48 hours before surgery.  Contact lenses, hearing aids and dentures may not be worn into surgery.  Do not bring valuables to the hospital. [facility] is not responsible for any missing/lost belongings or valuables.   Notify your doctor if there is any change in your medical condition (cold, fever, infection).  Wear comfortable clothing (specific to your surgery type) to the hospital.  After surgery, you can help prevent lung complications by doing breathing exercises.  Take deep breaths and cough every 1-2 hours. Your doctor may order a device called an  Incentive Spirometer to help you take deep breaths. When coughing or sneezing, hold a pillow firmly against your incision with both hands. This is called splinting. Doing this helps protect your incision. It also decreases belly discomfort.  If you are being admitted to the hospital overnight, leave your suitcase in the car. After surgery it may be brought to your room.  In case of increased patient census, it  may be necessary for you, the patient, to continue your postoperative care in the Same Day Surgery department.  If you are being discharged the day of surgery, you will not be allowed to drive home. You will need a responsible individual to drive you home and stay with you for 24 hours after surgery.   If you are taking public transportation, you will need to have a responsible individual with you.  Please call the Pre-admissions Testing Dept. at [phone] if you have any questions about these instructions.  Surgery Visitation Policy:  Patients having surgery or a procedure may have two visitors.  Children under the age of 38 must have an adult with them who is not the patient.   Merchandiser, Retail to address health-related social needs:  [email or web address]

## 2024-04-25 NOTE — Anesthesia Preprocedure Evaluation (Signed)
"                                    Anesthesia Evaluation  Patient identified by MRN, date of birth, ID band Patient awake    Reviewed: Allergy & Precautions, H&P , NPO status , Patient's Chart, lab work & pertinent test results  Airway Mallampati: II  TM Distance: >3 FB Neck ROM: full    Dental no notable dental hx.    Pulmonary neg pulmonary ROS   Pulmonary exam normal        Cardiovascular negative cardio ROS Normal cardiovascular exam     Neuro/Psych  PSYCHIATRIC DISORDERS Anxiety     negative neurological ROS     GI/Hepatic negative GI ROS, Neg liver ROS,,,  Endo/Other  negative endocrine ROS    Renal/GU      Musculoskeletal   Abdominal Normal abdominal exam  (+)   Peds  Hematology negative hematology ROS (+)   Anesthesia Other Findings Past Medical History: No date: Anxiety 02/17/2024: Diastasis recti No date: GERD (gastroesophageal reflux disease) No date: H/O iron deficiency anemia No date: LUQ pain No date: Multiple lipomas     Comment:  bilateral upper and lower extremities No date: Pre-diabetes No date: Pure hypercholesterolemia 02/17/2024: Umbilical hernia  Past Surgical History: No date: COLONOSCOPY No date: KNEE SURGERY; Right 02/08/2019: LIPOMA EXCISION; Right     Comment:  RUE x 3 lipomas 03/18/2019: LIPOMA EXCISION; Left     Comment:  LUE x 1 lipoma 03/12/2020: LIPOMA EXCISION; Left     Comment:  Left thigh/hip x 5 lipomas 04/04/2020: LIPOMA EXCISION; Right     Comment:  Right hip, RLQ, right chest wall -- 3 total 02/01/2024: LIPOMA EXCISION; Left     Comment:  left upper arm, epigastric abdominal wall -- 2 total No date: NASAL ENDOSCOPY No date: SHOULDER SURGERY; Right No date: TONSILLECTOMY     Reproductive/Obstetrics negative OB ROS                              Anesthesia Physical Anesthesia Plan  ASA: 2  Anesthesia Plan: General ETT   Post-op Pain Management: Tylenol  PO  (pre-op)*, Celebrex  PO (pre-op)* and Gabapentin  PO (pre-op)*   Induction: Intravenous  PONV Risk Score and Plan: 2 and Ondansetron , Dexamethasone  and Midazolam   Airway Management Planned: Oral ETT  Additional Equipment:   Intra-op Plan:   Post-operative Plan: Extubation in OR  Informed Consent: I have reviewed the patients History and Physical, chart, labs and discussed the procedure including the risks, benefits and alternatives for the proposed anesthesia with the patient or authorized representative who has indicated his/her understanding and acceptance.     Dental Advisory Given  Plan Discussed with: CRNA and Surgeon  Anesthesia Plan Comments:          Anesthesia Quick Evaluation  "

## 2024-04-26 ENCOUNTER — Other Ambulatory Visit: Payer: Self-pay

## 2024-04-26 ENCOUNTER — Encounter: Payer: Self-pay | Admitting: Surgery

## 2024-04-26 ENCOUNTER — Ambulatory Visit: Payer: Self-pay | Admitting: Anesthesiology

## 2024-04-26 ENCOUNTER — Ambulatory Visit
Admission: RE | Admit: 2024-04-26 | Discharge: 2024-04-26 | Disposition: A | Discharge status: Home / Self Care | Attending: Surgery | Admitting: Surgery

## 2024-04-26 ENCOUNTER — Ambulatory Visit: Payer: Self-pay | Admitting: Urgent Care

## 2024-04-26 ENCOUNTER — Encounter
Admission: RE | Disposition: A | Discharge status: Home / Self Care | Payer: Self-pay | Source: Home / Self Care | Attending: Surgery

## 2024-04-26 DIAGNOSIS — K219 Gastro-esophageal reflux disease without esophagitis: Secondary | ICD-10-CM | POA: Diagnosis not present

## 2024-04-26 DIAGNOSIS — M6208 Separation of muscle (nontraumatic), other site: Secondary | ICD-10-CM | POA: Diagnosis present

## 2024-04-26 DIAGNOSIS — K429 Umbilical hernia without obstruction or gangrene: Secondary | ICD-10-CM

## 2024-04-26 HISTORY — PX: INSERTION OF MESH: SHX5868

## 2024-04-26 SURGERY — REPAIR, HERNIA, UMBILICAL, ROBOT-ASSISTED
Anesthesia: General

## 2024-04-26 MED ORDER — IBUPROFEN 800 MG PO TABS: 800.0000 mg | ORAL_TABLET | Freq: Three times a day (TID) | ORAL | 1 refills | Status: AC | PRN

## 2024-04-26 MED ORDER — SODIUM CHLORIDE (PF) 0.9 % IJ SOLN
INTRAMUSCULAR | Status: DC | PRN
  Administered 2024-04-26: 80 mL via INTRAMUSCULAR

## 2024-04-26 MED ORDER — LIDOCAINE HCL (PF) 2 % IJ SOLN
INTRAMUSCULAR | Status: AC
  Filled 2024-04-26: qty 5

## 2024-04-26 MED ORDER — SODIUM CHLORIDE (PF) 0.9 % IJ SOLN
INTRAMUSCULAR | Status: AC
  Filled 2024-04-26: qty 10

## 2024-04-26 MED ORDER — OXYCODONE HCL 5 MG PO TABS: 5.0000 mg | ORAL_TABLET | ORAL | 0 refills | Status: DC | PRN

## 2024-04-26 MED ORDER — GABAPENTIN 300 MG PO CAPS
ORAL_CAPSULE | ORAL | Status: AC
  Filled 2024-04-26: qty 1

## 2024-04-26 MED ORDER — CELECOXIB 200 MG PO CAPS
ORAL_CAPSULE | ORAL | Status: AC
  Filled 2024-04-26: qty 1

## 2024-04-26 MED ORDER — SUGAMMADEX SODIUM 200 MG/2ML IV SOLN
INTRAVENOUS | Status: DC | PRN
  Administered 2024-04-26: 200 mg via INTRAVENOUS

## 2024-04-26 MED ORDER — ONDANSETRON HCL 4 MG/2ML IJ SOLN
INTRAMUSCULAR | Status: DC | PRN
  Administered 2024-04-26: 4 mg via INTRAVENOUS

## 2024-04-26 MED ORDER — OXYCODONE HCL 5 MG PO TABS
ORAL_TABLET | ORAL | Status: AC
  Filled 2024-04-26: qty 1

## 2024-04-26 MED ORDER — KETAMINE HCL 10 MG/ML IJ SOLN
INTRAMUSCULAR | Status: DC | PRN
  Administered 2024-04-26: 20 mg via INTRAVENOUS
  Administered 2024-04-26: 10 mg via INTRAVENOUS

## 2024-04-26 MED ORDER — ACETAMINOPHEN 500 MG PO TABS
1000.0000 mg | ORAL_TABLET | Freq: Once | ORAL | Status: AC
  Administered 2024-04-26: 1000 mg via ORAL

## 2024-04-26 MED ORDER — FENTANYL CITRATE (PF) 100 MCG/2ML IJ SOLN
25.0000 ug | INTRAMUSCULAR | Status: DC | PRN
  Administered 2024-04-26 (×4): 25 ug via INTRAVENOUS

## 2024-04-26 MED ORDER — PROPOFOL 10 MG/ML IV BOLUS
INTRAVENOUS | Status: AC
  Filled 2024-04-26: qty 20

## 2024-04-26 MED ORDER — ROCURONIUM BROMIDE 10 MG/ML (PF) SYRINGE
PREFILLED_SYRINGE | INTRAVENOUS | Status: AC
  Filled 2024-04-26: qty 10

## 2024-04-26 MED ORDER — CYCLOBENZAPRINE HCL 5 MG PO TABS: 5.0000 mg | ORAL_TABLET | Freq: Three times a day (TID) | ORAL | 0 refills | Status: AC | PRN

## 2024-04-26 MED ORDER — FENTANYL CITRATE (PF) 100 MCG/2ML IJ SOLN
INTRAMUSCULAR | Status: AC
  Filled 2024-04-26: qty 2

## 2024-04-26 MED ORDER — BUPIVACAINE-EPINEPHRINE (PF) 0.5% -1:200000 IJ SOLN
INTRAMUSCULAR | Status: AC
  Filled 2024-04-26: qty 30

## 2024-04-26 MED ORDER — DEXMEDETOMIDINE HCL IN NACL 80 MCG/20ML IV SOLN
INTRAVENOUS | Status: DC | PRN
  Administered 2024-04-26: 12 ug via INTRAVENOUS

## 2024-04-26 MED ORDER — ONDANSETRON HCL 4 MG/2ML IJ SOLN
INTRAMUSCULAR | Status: AC
  Filled 2024-04-26: qty 2

## 2024-04-26 MED ORDER — CEFAZOLIN SODIUM-DEXTROSE 2-4 GM/100ML-% IV SOLN
INTRAVENOUS | Status: AC
  Filled 2024-04-26: qty 100

## 2024-04-26 MED ORDER — BUPIVACAINE LIPOSOME 1.3 % IJ SUSP: 20.0000 mL | Freq: Once | INTRAMUSCULAR | Status: DC

## 2024-04-26 MED ORDER — ACETAMINOPHEN 500 MG PO TABS
ORAL_TABLET | ORAL | Status: AC
  Filled 2024-04-26: qty 2

## 2024-04-26 MED ORDER — GABAPENTIN 300 MG PO CAPS: 300.0000 mg | ORAL_CAPSULE | ORAL | Status: DC

## 2024-04-26 MED ORDER — PHENYLEPHRINE 80 MCG/ML (10ML) SYRINGE FOR IV PUSH (FOR BLOOD PRESSURE SUPPORT)
PREFILLED_SYRINGE | INTRAVENOUS | Status: AC
  Filled 2024-04-26: qty 10

## 2024-04-26 MED ORDER — MIDAZOLAM HCL (PF) 2 MG/2ML IJ SOLN
INTRAMUSCULAR | Status: DC | PRN
  Administered 2024-04-26: 2 mg via INTRAVENOUS

## 2024-04-26 MED ORDER — PHENYLEPHRINE 80 MCG/ML (10ML) SYRINGE FOR IV PUSH (FOR BLOOD PRESSURE SUPPORT)
PREFILLED_SYRINGE | INTRAVENOUS | Status: DC | PRN
  Administered 2024-04-26 (×2): 80 ug via INTRAVENOUS

## 2024-04-26 MED ORDER — OXYCODONE HCL 5 MG/5ML PO SOLN: 5.0000 mg | Freq: Once | ORAL | Status: AC | PRN

## 2024-04-26 MED ORDER — SODIUM CHLORIDE (PF) 0.9 % IJ SOLN
INTRAMUSCULAR | Status: AC
  Filled 2024-04-26: qty 20

## 2024-04-26 MED ORDER — KETAMINE HCL 50 MG/5ML IJ SOSY
PREFILLED_SYRINGE | INTRAMUSCULAR | Status: AC
  Filled 2024-04-26: qty 5

## 2024-04-26 MED ORDER — DEXAMETHASONE SOD PHOSPHATE PF 10 MG/ML IJ SOLN
INTRAMUSCULAR | Status: DC | PRN
  Administered 2024-04-26: 10 mg via INTRAVENOUS

## 2024-04-26 MED ORDER — BUPIVACAINE LIPOSOME 1.3 % IJ SUSP
INTRAMUSCULAR | Status: AC
  Filled 2024-04-26: qty 20

## 2024-04-26 MED ORDER — CEFAZOLIN SODIUM-DEXTROSE 2-4 GM/100ML-% IV SOLN
2.0000 g | INTRAVENOUS | Status: AC
  Administered 2024-04-26: 2 g via INTRAVENOUS

## 2024-04-26 MED ORDER — DROPERIDOL 2.5 MG/ML IJ SOLN: 0.6250 mg | Freq: Once | INTRAMUSCULAR | Status: DC | PRN

## 2024-04-26 MED ORDER — OXYCODONE HCL 5 MG PO TABS
5.0000 mg | ORAL_TABLET | Freq: Once | ORAL | Status: AC | PRN
  Administered 2024-04-26: 5 mg via ORAL

## 2024-04-26 MED ORDER — LACTATED RINGERS IV SOLN: INTRAVENOUS | Status: DC

## 2024-04-26 MED ORDER — ACETAMINOPHEN 10 MG/ML IV SOLN: 1000.0000 mg | Freq: Once | INTRAVENOUS | Status: DC | PRN

## 2024-04-26 MED ORDER — ROCURONIUM BROMIDE 100 MG/10ML IV SOLN
INTRAVENOUS | Status: DC | PRN
  Administered 2024-04-26 (×5): 10 mg via INTRAVENOUS
  Administered 2024-04-26: 60 mg via INTRAVENOUS
  Administered 2024-04-26: 20 mg via INTRAVENOUS

## 2024-04-26 MED ORDER — CELECOXIB 200 MG PO CAPS
200.0000 mg | ORAL_CAPSULE | Freq: Once | ORAL | Status: AC
  Administered 2024-04-26: 200 mg via ORAL

## 2024-04-26 MED ORDER — MIDAZOLAM HCL 2 MG/2ML IJ SOLN
INTRAMUSCULAR | Status: AC
  Filled 2024-04-26: qty 2

## 2024-04-26 MED ORDER — FENTANYL CITRATE (PF) 100 MCG/2ML IJ SOLN
INTRAMUSCULAR | Status: DC | PRN
  Administered 2024-04-26 (×2): 50 ug via INTRAVENOUS

## 2024-04-26 MED ORDER — ACETAMINOPHEN 500 MG PO TABS: 1000.0000 mg | ORAL_TABLET | ORAL | Status: DC

## 2024-04-26 MED ORDER — GABAPENTIN 300 MG PO CAPS
300.0000 mg | ORAL_CAPSULE | Freq: Once | ORAL | Status: AC
  Administered 2024-04-26: 300 mg via ORAL

## 2024-04-26 MED ORDER — LIDOCAINE HCL (CARDIAC) PF 100 MG/5ML IV SOSY
PREFILLED_SYRINGE | INTRAVENOUS | Status: DC | PRN
  Administered 2024-04-26: 100 mg via INTRAVENOUS

## 2024-04-26 MED ORDER — PROPOFOL 10 MG/ML IV BOLUS
INTRAVENOUS | Status: DC | PRN
  Administered 2024-04-26: 180 mg via INTRAVENOUS

## 2024-04-26 MED ORDER — ACETAMINOPHEN 500 MG PO TABS: 1000.0000 mg | ORAL_TABLET | Freq: Four times a day (QID) | ORAL | Status: AC | PRN

## 2024-04-26 SURGICAL SUPPLY — 45 items
ADHESIVE MASTISOL STRL (MISCELLANEOUS) IMPLANT
BINDER ABD UNIV 9 30-45 (GAUZE/BANDAGES/DRESSINGS) IMPLANT
CANNULA CAP OBTURATR AIRSEAL 8 (CAP) IMPLANT
COVER TIP SHEARS 8 DVNC (MISCELLANEOUS) ×2 IMPLANT
COVER WAND RF STERILE (DRAPES) ×2 IMPLANT
DEFOGGER SCOPE WARM SEASHARP (MISCELLANEOUS) ×2 IMPLANT
DERMABOND ADVANCED .7 DNX12 (GAUZE/BANDAGES/DRESSINGS) ×2 IMPLANT
DRAPE ARM DVNC X/XI (DISPOSABLE) ×6 IMPLANT
DRAPE COLUMN DVNC XI (DISPOSABLE) ×2 IMPLANT
DRSG TEGADERM 2-3/8X2-3/4 SM (GAUZE/BANDAGES/DRESSINGS) IMPLANT
ELECTRODE CAUTERY BLDE TIP 2.5 (TIP) ×2 IMPLANT
ELECTRODE REM PT RTRN 9FT ADLT (ELECTROSURGICAL) ×2 IMPLANT
FORCEPS BPLR R/ABLATION 8 DVNC (INSTRUMENTS) ×2 IMPLANT
GAUZE SPONGE 2X2 STRL 8-PLY (GAUZE/BANDAGES/DRESSINGS) IMPLANT
GLOVE SURG SYN 7.0 PF PI (GLOVE) ×4 IMPLANT
GLOVE SURG SYN 7.5 PF PI (GLOVE) ×4 IMPLANT
GOWN STRL REUS W/ TWL LRG LVL3 (GOWN DISPOSABLE) ×6 IMPLANT
GRASPER SUT TROCAR 14GX15 (MISCELLANEOUS) ×2 IMPLANT
IRRIGATION STRYKERFLOW (MISCELLANEOUS) IMPLANT
IV 0.9% NACL 1000 ML (IV SOLUTION) IMPLANT
KIT PINK PAD W/HEAD ARM REST (MISCELLANEOUS) ×2 IMPLANT
LABEL OR SOLS (LABEL) ×2 IMPLANT
MANIFOLD NEPTUNE II (INSTRUMENTS) ×2 IMPLANT
MESH VENT LT ST 15CM CRL ECHO2 (Mesh General) IMPLANT
NEEDLE DRIVE SUT CUT DVNC (INSTRUMENTS) ×2 IMPLANT
NEEDLE HYPO 22X1.5 SAFETY MO (MISCELLANEOUS) ×2 IMPLANT
NEEDLE INSUFFLATION 14GA 120MM (NEEDLE) ×2 IMPLANT
OBTURATOR OPTICALSTD 8 DVNC (TROCAR) ×2 IMPLANT
PACK LAP CHOLECYSTECTOMY (MISCELLANEOUS) ×2 IMPLANT
SCISSORS MNPLR CVD DVNC XI (INSTRUMENTS) ×2 IMPLANT
SEAL UNIV 5-12 XI (MISCELLANEOUS) ×4 IMPLANT
SET TUBE FILTERED XL AIRSEAL (SET/KITS/TRAYS/PACK) IMPLANT
SET TUBE SMOKE EVAC HIGH FLOW (TUBING) ×2 IMPLANT
SOLN STERILE WATER 500 ML (IV SOLUTION) ×2 IMPLANT
SOLUTION ELECTROSURG ANTI STCK (MISCELLANEOUS) ×2 IMPLANT
SPONGE T-LAP 18X18 ~~LOC~~+RFID (SPONGE) IMPLANT
STRIP CLOSURE SKIN 1/4X4 (GAUZE/BANDAGES/DRESSINGS) IMPLANT
SUT STRATA 2-0 30 CT-2 (SUTURE) ×4 IMPLANT
SUT STRATAFIX PDS 30 CT-1 (SUTURE) ×2 IMPLANT
SUT VIC AB 2-0 SH 27XBRD (SUTURE) ×2 IMPLANT
SUT VIC AB 3-0 SH 27X BRD (SUTURE) ×2 IMPLANT
SUT VICRYL 0 UR6 27IN ABS (SUTURE) ×4 IMPLANT
SUTURE MNCRL 4-0 27XMF (SUTURE) ×2 IMPLANT
SYSTEM BAG RETRIEVAL 10MM (BASKET) IMPLANT
TRAY FOLEY SLVR 16FR LF STAT (SET/KITS/TRAYS/PACK) ×2 IMPLANT

## 2024-04-26 NOTE — Transfer of Care (Signed)
 Immediate Anesthesia Transfer of Care Note  Patient: Alexander Turner Getting IV  Procedure(s) Performed: REPAIR, HERNIA, UMBILICAL, ROBOT-ASSISTED INSERTION OF MESH  Patient Location: PACU  Anesthesia Type:General  Level of Consciousness: drowsy  Airway & Oxygen Therapy: Patient Spontanous Breathing and Patient connected to face mask oxygen  Post-op Assessment: Report given to RN  Post vital signs: stable  Last Vitals:  Vitals Value Taken Time  BP 129/87 04/26/24 15:32  Temp    Pulse 85 04/26/24 15:35  Resp 18 04/26/24 15:35  SpO2 97 % 04/26/24 15:35  Vitals shown include unfiled device data.  Last Pain:  Vitals:   04/26/24 1020  TempSrc: Temporal  PainSc: 0-No pain         Complications: No notable events documented.

## 2024-04-26 NOTE — Op Note (Signed)
 " Procedure Date:  04/26/2024  Pre-operative Diagnosis:  Umbilical hernia and diastasis recti  Post-operative Diagnosis:  Umbilical hernia 1 cm defect, diastasis recti of total length of 15 cm and width 5 cm.  Procedure:  Robotic assisted Umbilical Hernia Repair and plication of long diastasis recti with mesh  Surgeon:  Aloysius Sheree Plant, MD  Anesthesia:  General endotracheal  Estimated Blood Loss:  20 ml  Specimens:  None  Complications:  None  Indications for Procedure:  This is a 58 y.o. male who presents with an umbilical hernia and long segment of diastasis recti.  The options of surgery versus observation were reviewed with the patient and/or family. The risks of bleeding, abscess or infection, recurrence of symptoms, potential for an open procedure, injury to surrounding structures, and chronic pain were all discussed with the patient and was willing to proceed.  Description of Procedure: The patient was correctly identified in the preoperative area and brought into the operating room.  The patient was placed supine with VTE prophylaxis in place.  Appropriate time-outs were performed.  Anesthesia was induced and the patient was intubated.  Appropriate antibiotics were infused.  The abdomen was prepped and draped in a sterile fashion. The patient's hernia defect was marked with a marking pen.  A Veress needle was introduced in the left upper quadrant and pneumoperitoneum was obtained with appropriate pressures.  Using Optiview technique, an 8 mm port was introduced in the left lateral abdominal wall without complications.  Then, a 12 mm port was introduced in the left upper quadrant and an 8 mm port in the left lower quadrant under direct visualization.  The DaVinci platform was docked, camera targeted, and instruments placed under direct visualization.  The patient's preperitoneal fat was resected from the subxiphoid area down to below the umbilicus.  This allowed full exposure of the  patient's complete length of the diastasis recti and the hernia.  The hernia defect contained preperitoneal fat and this was reduced easily while the fat was being resected.  The defect measured 1 cm.  The diastasis was 15 cm in length and 5 cm in width.  Thus, it was decided that a length of about 23 cm would be best for mesh in order to overlap appropriately.  Two 4x6 in Bard Ventralight ST Echo mesh, two 0 Stratafix suture, and five 2-0 stratafix sutures were inserted through the 12 mm port under direct visualization.   The hernia defect was closed and the full length of diastasis plicated using the stratafix sutures.  A PMI was brought through two separate points along the repair/plication length and the positioning system of each mesh was passed through.  This allowed each mesh to splay open and have a very good overlap between mesh and with the plication and hernia repair.  The two mesh were then sutured in place circumferentially and through the center of the mesh using the 2-0 Stratafix sutures.  All needles and the positioning system were then removed through the 12 mm port without complications.  The DaVinci platform was then undocked and instruments removed.  The preperitoneal fat was placed in an Endocatch bag.  80 ml of Exparel solution mixed with 0.5% bupivacaine  with epi was infiltrated around the mesh edges, hernia repair site, and port sites.  The 12 mm port was removed and the bag retrieved.  The fascia was closed under direct visualization utilizing an Endo Close technique with 0 Vicryl suture.  The 8 mm ports were removed. The 12 mm  incision was closed using 3-0 Vicryl and 4-0 Monocryl, and the other port incisions were closed with 4-0 Monocryl.  The wounds were cleaned and sealed with DermaBond.  The patient was emerged from anesthesia and extubated and brought to the recovery room for further management.  The patient tolerated the procedure well and all counts were correct at the end of  the case.   Aloysius Sheree Plant, MD  "

## 2024-04-26 NOTE — H&P (Signed)
 " 04/26/2024  History of Present Illness: Alexander Turner is a 58 y.o. male presenting for evaluation of an umbilical hernia.  The patient is well known to our office and has had excisions of multiple lipomas in both upper and lower extremities as well as abdominal wall.  His most recent excision was on 02/01/24.  He presents today because he wanted to address a possible umbilical hernia.  He has had this for a while and he also notices an area that bulges in the midline.  The hernia bulges when he's doing something strenuous, and reduces on its own.  Denies any worsening pain but endorses discomfort with bulging.    Past Medical History: Past Medical History:  Diagnosis Date   Anxiety    Diastasis recti 02/17/2024   GERD (gastroesophageal reflux disease)    H/O iron deficiency anemia    LUQ pain    Multiple lipomas    bilateral upper and lower extremities   Pre-diabetes    Pure hypercholesterolemia    Umbilical hernia 02/17/2024     Past Surgical History: Past Surgical History:  Procedure Laterality Date   COLONOSCOPY     KNEE SURGERY Right    LIPOMA EXCISION Right 02/08/2019   RUE x 3 lipomas   LIPOMA EXCISION Left 03/18/2019   LUE x 1 lipoma   LIPOMA EXCISION Left 03/12/2020   Left thigh/hip x 5 lipomas   LIPOMA EXCISION Right 04/04/2020   Right hip, RLQ, right chest wall -- 3 total   LIPOMA EXCISION Left 02/01/2024   left upper arm, epigastric abdominal wall -- 2 total   NASAL ENDOSCOPY     SHOULDER SURGERY Right    TONSILLECTOMY      Home Medications: Prior to Admission medications   Medication Sig Start Date End Date Taking? Authorizing Provider  celecoxib  (CELEBREX ) 100 MG capsule Take 100 mg by mouth 2 (two) times daily. 01/17/20   [provider]  mometasone (NASONEX) 50 MCG/ACT nasal spray Place 2 sprays into the nose daily.    [provider]  traZODone  (DESYREL ) 50 MG tablet Take 1-2 tablets (50-100 mg total) by mouth at bedtime. 01/15/24    Teresa Redell DELENA, NP  Vilazodone  HCl (VIIBRYD ) 40 MG TABS Take 1 tablet (40 mg total) by mouth daily. 01/15/24   Teresa Redell DELENA, NP  zolpidem  (AMBIEN ) 10 MG tablet Take 1 tablet (10 mg total) by mouth at bedtime. 01/15/24   Teresa Redell DELENA, NP    Allergies: Allergies  Allergen Reactions   Chlorhexidine Itching and Dermatitis    Reaction needed to be treated with corticosteroids   Cyanoacrylate Itching and Dermatitis    Reaction needed to be treated with corticosteroids   Doxycycline Diarrhea and Nausea And Vomiting    Review of Systems: Review of Systems  Constitutional:  Negative for chills and fever.  Respiratory:  Negative for shortness of breath.   Cardiovascular:  Negative for chest pain.  Gastrointestinal:  Negative for abdominal pain, nausea and vomiting.    Physical Exam BP (!) 115/95   Pulse 80   Temp (!) 97.1 F (36.2 C) (Temporal)   Resp 17   Ht 5' 10 (1.778 m)   Wt 79.4 kg   SpO2 96%   BMI 25.11 kg/m  CONSTITUTIONAL: No acute distress, well nourished. HEENT:  Normocephalic, atraumatic, extraocular motion intact. RESPIRATORY:  Lungs are clear, and breath sounds are equal bilaterally. Normal respiratory effort without pathologic use of accessory muscles. CARDIOVASCULAR: Heart is regular without  murmurs, gallops, or rubs. GI: The abdomen is soft, non-distended, non-tender.  The patient has a 1-2 cm umbilical hernia which is reducible.  This is associated with a diastasis recti of the upper abdomen with a separation of about 3 cm.  SKIN:  LUE and abdominal wall incisions are healing well and are clean, dry, intact. NEUROLOGIC:  Motor and sensation is grossly normal.  Cranial nerves are grossly intact. PSYCH:  Alert and oriented to person, place and time. Affect is normal.  Labs/Imaging: Labs from 04/03/23: Na 140, K 3.6, Cl 106, CO2 24, BUN 18, Cr 1.22.  WBC 7.1, Hgb 13.8, Hct 41.3, Plt 278.  Assessment and Plan: This is a 58 y.o. male with an umbilical hernia and  diastasis recti.  --Discussed with the patient the findings on exam showing an umbilical hernia and an associated diastasis recti.  Discussed how both can form and happen.  Although diastasis is not a hernia itself, the fascia is weakened by the stretching and can increase the risk of a hernia.  As such, would recommend repairing not just the hernia but also plicating the diastasis.  He is in agreement. --Discussed with him then the plan for a robotic assisted umbilical hernia repair with plication of diastasis recti.  Reviewed the surgery at length with him including the planned incisions, the risks of bleeding, infection, injury to surrounding structures, that this would be an outpatient surgery, post-operative activity restrictions, pain control, and he's willing to proceed.   Aloysius Sheree Plant, MD [facility]      "

## 2024-04-26 NOTE — Discharge Instructions (Signed)
 Discharge Instructions: 1.  Patient may shower, but do not scrub wounds heavily and dab dry only. 2.  Do not submerge wounds in pool/tub until fully healed. 3.  May remove dressings on 04/28/24.  Leave the steri strips in place.  These will fall off on their own. 4.  May apply ice packs to the wounds for comfort. 5.  Please wear abdominal binder at all times for the next 4 weeks.  May remove to shower/wash. 6.  Do not drive while taking narcotics for pain control.  Prior to driving, make sure you are able to rotate right and left to look at blindspots without significant pain or discomfort. 7.  No heavy lifting or pushing of more than 10-15 lbs for 6 weeks.

## 2024-04-26 NOTE — Anesthesia Procedure Notes (Signed)
 Procedure Name: Intubation Date/Time: 04/26/2024 12:37 PM  Performed by: Birtha Cheron BRAVO, CRNAPre-anesthesia Checklist: Patient identified, Emergency Drugs available, Patient being monitored, Timeout performed and Suction available Patient Re-evaluated:Patient Re-evaluated prior to induction Oxygen Delivery Method: Circle system utilized Preoxygenation: Pre-oxygenation with 100% oxygen Induction Type: IV induction Ventilation: Mask ventilation without difficulty Laryngoscope Size: McGrath and 3 Grade View: Grade I Tube type: Oral Tube size: 7.0 mm Number of attempts: 1 Airway Equipment and Method: Stylet Placement Confirmation: ETT inserted through vocal cords under direct vision, positive ETCO2 and breath sounds checked- equal and bilateral Secured at: 22 cm Tube secured with: Tape Dental Injury: Teeth and Oropharynx as per pre-operative assessment

## 2024-04-26 NOTE — Anesthesia Postprocedure Evaluation (Signed)
"   Anesthesia Post Note  Patient: Alexander Turner Getting IV  Procedure(s) Performed: REPAIR, HERNIA, UMBILICAL, ROBOT-ASSISTED INSERTION OF MESH  Patient location during evaluation: PACU Anesthesia Type: General Level of consciousness: awake and alert Pain management: pain level controlled Vital Signs Assessment: post-procedure vital signs reviewed and stable Respiratory status: spontaneous breathing, nonlabored ventilation, respiratory function stable and patient connected to nasal cannula oxygen Cardiovascular status: blood pressure returned to baseline and stable Postop Assessment: no apparent nausea or vomiting Anesthetic complications: no   No notable events documented.   Last Vitals:  Vitals:   04/26/24 1621 04/26/24 1638  BP: 131/71 131/84  Pulse: 96 89  Resp: 13 15  Temp: 36.6 C 36.6 C  SpO2: 96% 97%    Last Pain:  Vitals:   04/26/24 1638  TempSrc: Temporal  PainSc: 3                  Prentice Murphy      "

## 2024-04-27 ENCOUNTER — Encounter: Payer: Self-pay | Admitting: Surgery

## 2024-05-11 ENCOUNTER — Ambulatory Visit (INDEPENDENT_AMBULATORY_CARE_PROVIDER_SITE_OTHER): Payer: Self-pay | Admitting: Psychology

## 2024-05-11 ENCOUNTER — Ambulatory Visit (INDEPENDENT_AMBULATORY_CARE_PROVIDER_SITE_OTHER): Admitting: Surgery

## 2024-05-11 ENCOUNTER — Encounter: Payer: Self-pay | Admitting: Surgery

## 2024-05-11 VITALS — BP 126/82 | HR 96 | Temp 98.9°F | Ht 70.0 in | Wt 178.6 lb

## 2024-05-11 DIAGNOSIS — K429 Umbilical hernia without obstruction or gangrene: Secondary | ICD-10-CM | POA: Diagnosis not present

## 2024-05-11 DIAGNOSIS — F4323 Adjustment disorder with mixed anxiety and depressed mood: Secondary | ICD-10-CM

## 2024-05-11 DIAGNOSIS — Z09 Encounter for follow-up examination after completed treatment for conditions other than malignant neoplasm: Secondary | ICD-10-CM | POA: Diagnosis not present

## 2024-05-11 DIAGNOSIS — M6208 Separation of muscle (nontraumatic), other site: Secondary | ICD-10-CM | POA: Diagnosis not present

## 2024-05-11 NOTE — Patient Instructions (Signed)

## 2024-05-11 NOTE — Progress Notes (Signed)
 " 05/11/2024  History of Present Illness: Alexander Turner is a 59 y.o. male s/p robotic assisted umbilical hernia repair and plication of diastasis recti on 04/26/24.  Patient presents for follow up.  He reports soreness at the LUQ incision, but otherwise doing better.  He also reports initial tenderness in the midline, but that has also improved.  No issues with rash/allergic reaction with betadine prep and steri strips.  Past Medical History: Past Medical History:  Diagnosis Date   Anxiety    Diastasis recti 02/17/2024   GERD (gastroesophageal reflux disease)    H/O iron deficiency anemia    LUQ pain    Multiple lipomas    bilateral upper and lower extremities   Pre-diabetes    Pure hypercholesterolemia    Umbilical hernia 02/17/2024     Past Surgical History: Past Surgical History:  Procedure Laterality Date   COLONOSCOPY     INSERTION OF MESH  04/26/2024   Procedure: INSERTION OF MESH;  Surgeon: Desiderio Schanz, MD;  Location: [facility] ORS;  Service: General;;   KNEE SURGERY Right    LIPOMA EXCISION Right 02/08/2019   RUE x 3 lipomas   LIPOMA EXCISION Left 03/18/2019   LUE x 1 lipoma   LIPOMA EXCISION Left 03/12/2020   Left thigh/hip x 5 lipomas   LIPOMA EXCISION Right 04/04/2020   Right hip, RLQ, right chest wall -- 3 total   LIPOMA EXCISION Left 02/01/2024   left upper arm, epigastric abdominal wall -- 2 total   NASAL ENDOSCOPY     SHOULDER SURGERY Right    TONSILLECTOMY      Home Medications: Prior to Admission medications  Medication Sig Start Date End Date Taking? Authorizing Provider  acetaminophen  (TYLENOL ) 500 MG tablet Take 2 tablets (1,000 mg total) by mouth every 6 (six) hours as needed for mild pain (pain score 1-3). 04/26/24  Yes Tameem Pullara, Schanz, MD  ALPRAZolam  (XANAX ) 0.25 MG tablet Take 1 tablet (0.25 mg total) by mouth daily. Patient taking differently: Take 0.25 mg by mouth as needed for anxiety. 04/07/24  Yes White, Redell DELENA, NP  Cholecalciferol 20 MCG  (800 UNIT) TABS Take 1 tablet by mouth 3 (three) times a week.   Yes [provider]  cyclobenzaprine  (FLEXERIL ) 5 MG tablet Take 1 tablet (5 mg total) by mouth 3 (three) times daily as needed for muscle spasms. 04/26/24  Yes Labarron Durnin, Schanz, MD  fluticasone (FLONASE) 50 MCG/ACT nasal spray Place 2 sprays into the nose daily. 03/21/24  Yes [provider]  ibuprofen  (ADVIL ) 800 MG tablet Take 1 tablet (800 mg total) by mouth every 8 (eight) hours as needed for moderate pain (pain score 4-6). 04/26/24  Yes Teralyn Mullins, MD  mometasone (NASONEX) 50 MCG/ACT nasal spray Place 2 sprays into the nose daily.   Yes [provider]  tadalafil (CIALIS) 20 MG tablet Take 20 mg by mouth daily as needed for erectile dysfunction.   Yes [provider]  traZODone  (DESYREL ) 50 MG tablet Take 1-2 tablets (50-100 mg total) by mouth at bedtime. 04/07/24  Yes Teresa Redell A, NP  Vilazodone  HCl (VIIBRYD ) 40 MG TABS Take 1 tablet (40 mg total) by mouth daily. Patient taking differently: Take 40 mg by mouth every morning. 04/07/24  Yes White, Redell A, NP  zolpidem  (AMBIEN ) 10 MG tablet Take 1 tablet (10 mg total) by mouth at bedtime. Patient taking differently: Take 10 mg by mouth at bedtime as needed. 04/07/24  Yes Teresa Redell DELENA, NP  Allergies: Allergies[1]  Review of Systems: Review of Systems  Constitutional:  Negative for chills and fever.  Respiratory:  Negative for shortness of breath.   Cardiovascular:  Negative for chest pain.  Gastrointestinal:  Positive for abdominal pain. Negative for nausea and vomiting.    Physical Exam BP 126/82   Pulse 96   Temp 98.9 F (37.2 C) (Oral)   Ht 5' 10 (1.778 m)   Wt 178 lb 9.6 oz (81 kg)   SpO2 96%   BMI 25.63 kg/m  CONSTITUTIONAL: No acute distress HEENT:  Normocephalic, atraumatic, extraocular motion intact. RESPIRATORY:  Normal respiratory effort without pathologic use of accessory muscles. CARDIOVASCULAR:  Regular  rhythm and rate. GI: The abdomen is soft, non-distended, appropriately sore to palpation.  Incisions are healing well and are clean, dry, intact.  No evidence of hernia recurrence.  NEUROLOGIC:  Motor and sensation is grossly normal.  Cranial nerves are grossly intact. PSYCH:  Alert and oriented to person, place and time. Affect is normal.   Assessment and Plan: This is a 59 y.o. male s/p robotic assisted umbilical hernia repair and plication of diastasis recti.  --Patient is healing appropriately after surgery, with appropriate soreness that is improving.  Discussed that the midline area will have a bump that will improve as the scar tissue softens/remodels.  The LUQ incision is also sore because of the deeper sutures needed to close it.  --Reminded him of activity restrictions. --Follow up as needed.  I spent 20 minutes dedicated to the care of this patient on the date of this encounter to include pre-visit review of records, face-to-face time with the patient discussing diagnosis and management, and any post-visit coordination of care.   Aloysius Sheree Plant, MD [facility]         [1]  Allergies Allergen Reactions   Chlorhexidine Itching and Dermatitis    Reaction needed to be treated with corticosteroids   Cyanoacrylate Itching and Dermatitis    Reaction needed to be treated with corticosteroids   Doxycycline Diarrhea and Nausea And Vomiting   "

## 2024-05-11 NOTE — Progress Notes (Signed)
 "   Behavioral Health Counselor/Therapist Progress Note  Patient ID: Alexander Turner, MRN: [id number],    Date: 05/11/2024  Time Spent: 32  mins; start time: 1100; stop time: 1132  Treatment Type: Individual Therapy  Reported Symptoms: Pt presents for the session via Caregility video  He states that he is in his home with no one else present; he grants consent for the session and states that he understands the limits of virtual sessions.  I shared with pt that I am in my office with no one else present here.    Mental Status Exam: Appearance:  Casual     Behavior: Appropriate  Motor: Normal  Speech/Language:  Clear and Coherent  Affect: Appropriate  Mood: normal  Thought process: normal  Thought content:   WNL  Sensory/Perceptual disturbances:   WNL  Orientation: oriented to person, place, and time/date  Attention: Good  Concentration: Good  Memory: WNL  Fund of knowledge:  Good  Insight:   Good  Judgment:  Good  Impulse Control: Good   Risk Assessment: Danger to Self:  No Self-injurious Behavior: No Danger to Others: No Duty to Warn:no Physical Aggression / Violence:No  Access to Firearms a concern: No  Gang Involvement:No   Notified of retirement  Subjective: Pt shares that I am 2 weeks post hernia surgery.  We had a nice Christmas; we went to see [name] and [name] and the baby and my mom and [name] came down as well.  My procedure went well and I am healing up.  Pt is enjoying not being at work and he is focusing on his health.  He took his dad to the beach last weekend and had a good time with him.  He is concerned that his dad may be developing some dementia.  [name] and the baby are coming to [place] next week and they will be going to see them.  They enjoyed their trip to [facility] with friends.  Pt reports that he recently had a dream that included his old boss and that was weird for him.  He understands that the work situation will ultimately work out for  him but it is stressful for him and now being without a job.  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  Pt is talking with [name] about it being best for them to slow their process down a bit to be sure they are making good decisions for themselves.  Pt shares that he would love to be able to work out but he knows that the doctor has not released him to do so yet.  Pt shares that [name] is continuing to work and he seems to be doing pretty well.  Pt continues to do well with his medication.  Encouraged pt to continue to engage with his self care activities as intentionally as he can and we will meet in 4 wks for a follow up session.  Interventions: Cognitive Behavioral Therapy  Diagnosis:Adjustment disorder with mixed anxiety and depressed mood  Plan: Treatment Plan Strengths/Abilities:  Intelligent, Intuitive, Willing to participate in therapy Treatment Preferences:  Outpatient Individual Therapy Statement of Needs:  Patient is to use CBT, mindfulness and coping skills to help manage and/or decrease symptoms associated with their diagnosis. Symptoms:  Depressed/Irritable mood, worry, social  withdrawal Problems Addressed:  Depressive thoughts, Sadness, Sleep issues, etc. Long Term Goals:  Pt to reduce overall level, frequency, and intensity of the feelings of depression/anxiety as evidenced by decreased irritability, negative self talk, and helpless feelings from 6 to 7 days/week to 0 to 1 days/week, per client report, for at least 3 consecutive months.  Progress: 20% Short Term Goals:  Pt to verbally express understanding of the relationship between feelings of depression/anxiety and their impact on thinking patterns and behaviors.  Pt to verbalize an understanding of the role that distorted thinking  plays in creating fears, excessive worry, and ruminations.  Progress: 20% Target Date:  07/28/2024 Frequency:  Bi-weekly Modality:  Cognitive Behavioral Therapy Interventions by Therapist:  Therapist will use CBT, Mindfulness exercises, Coping skills and Referrals, as needed by client. Client has verbally approved this treatment plan.  Francis KATHEE Macintosh, [facility] "

## 2024-06-02 ENCOUNTER — Ambulatory Visit: Admitting: Behavioral Health

## 2024-06-02 ENCOUNTER — Encounter: Payer: Self-pay | Admitting: Behavioral Health

## 2024-06-02 DIAGNOSIS — F331 Major depressive disorder, recurrent, moderate: Secondary | ICD-10-CM

## 2024-06-02 DIAGNOSIS — F5105 Insomnia due to other mental disorder: Secondary | ICD-10-CM

## 2024-06-02 DIAGNOSIS — F4323 Adjustment disorder with mixed anxiety and depressed mood: Secondary | ICD-10-CM

## 2024-06-02 NOTE — Progress Notes (Signed)
 "     [facility] Med Check  Patient ID: Alexander Turner,  MRN: [id number]  PCP: Lenon Layman ORN, MD  Date of Evaluation: 06/02/2024 Time spent:30 minutes  Chief Complaint:  Chief Complaint   Anxiety; Depression; Medication Refill; Patient Education; Follow-up     HISTORY/CURRENT STATUS: HPI  9, 59 year old male reports to this office for follow up and medication management.  Patient is calm and collected today.  Says that he was not granted disability but feels that it may be time to move on to maintain his happiness.  Medications continue to work well. He reports his depression today at 3/10 and anxiety at 3/10.   Patient states that he currently feels safe.  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  Reports no history of mania, no psychosis, no auditory or visual hallucinations.  No SI or HI.   Past psychiatric medication trials: Wellbutrin-Disturbing thoughts Lexapro-Report non effective, sexual side effects BuSpar-non effective Xanax     Individual Medical History/ Review of Systems: Changes? :No   Allergies: Chlorhexidine, Cyanoacrylate, and Doxycycline  Current Medications: Current Medications[1] Medication Side Effects: none  Family Medical/ Social History: Changes? No  MENTAL HEALTH EXAM:  There were no vitals taken for this visit.There is no height or weight on file to calculate BMI.  General Appearance: Casual, Neat, and Well Groomed  Eye Contact:  Good  Speech:  Clear and Coherent  Volume:  Normal  Mood:  NA  Affect:  Appropriate  Thought Process:  Coherent  Orientation:  Full (Time, Place, and Person)  Thought Content: Logical   Suicidal Thoughts:  No  Homicidal Thoughts:  No  Memory:  WNL  Judgement:  Good  Insight:  Good  Psychomotor Activity:  Normal  Concentration:  Concentration: Good  Recall:  Good  Fund of Knowledge: Good  Language: Good  Assets:  Desire for Improvement  ADL's:  Intact  Cognition: WNL  Prognosis:  Good     DIAGNOSES:    ICD-10-CM   1. Adjustment disorder with mixed anxiety and depressed mood  F43.23     2. Insomnia due to other mental disorder  F51.05    F99     3. Moderate episode of recurrent major depressive disorder (HCC)  F33.1       Receiving Psychotherapy: No    RECOMMENDATIONS:    Greater than 50% of 30  min  face to face time with patient was spent on counseling and coordination of care.  We discussed his significant improvement since last visit.  He is attempting to adjust to the changes due to loss of employment.  Right now he has time to consider other options and cope with the changes in a healthy way.  Working out regularly and would like to play more golf.  For now he is requesting no medication changes or adjustments today.    We agreed today to:   Continue Viibryd   20 mg daily.  Must take with food. To continue trazodone  50 mg at bedtime for sleep Continue Ambien  10 mg as  needed. To continue, #20, Xanax  0.25 mg daily only for severe anxiety Will report worsening symptoms or side effects promptly Will follow-up in 6 months to reassess or sooner if condition changes Provided emergency contact information number Discussed potential benefits, risk, and side effects of benzodiazepines to include potential risk of tolerance and dependence, as well as possible drowsiness.  Advised patient not to drive if experiencing drowsiness and to take lowest possible effective dose  to minimize risk of dependence and tolerance.  Reviewed PDMP      Redell DELENA Pizza, NP      [1]  Current Outpatient Medications:    acetaminophen  (TYLENOL ) 500 MG tablet, Take 2 tablets (1,000 mg total) by mouth every 6 (six) hours as needed for mild pain (pain score 1-3)., Disp: , Rfl:    ALPRAZolam  (XANAX ) 0.25 MG tablet, Take 1 tablet (0.25 mg total) by mouth daily. (Patient taking differently: Take 0.25 mg by mouth as needed for anxiety.), Disp: 30 tablet, Rfl: 3   Cholecalciferol 20 MCG (800  UNIT) TABS, Take 1 tablet by mouth 3 (three) times a week., Disp: , Rfl:    cyclobenzaprine  (FLEXERIL ) 5 MG tablet, Take 1 tablet (5 mg total) by mouth 3 (three) times daily as needed for muscle spasms., Disp: 30 tablet, Rfl: 0   fluticasone (FLONASE) 50 MCG/ACT nasal spray, Place 2 sprays into the nose daily., Disp: , Rfl:    ibuprofen  (ADVIL ) 800 MG tablet, Take 1 tablet (800 mg total) by mouth every 8 (eight) hours as needed for moderate pain (pain score 4-6)., Disp: 60 tablet, Rfl: 1   mometasone (NASONEX) 50 MCG/ACT nasal spray, Place 2 sprays into the nose daily., Disp: , Rfl:    tadalafil (CIALIS) 20 MG tablet, Take 20 mg by mouth daily as needed for erectile dysfunction., Disp: , Rfl:    traZODone  (DESYREL ) 50 MG tablet, Take 1-2 tablets (50-100 mg total) by mouth at bedtime., Disp: 180 tablet, Rfl: 1   Vilazodone  HCl (VIIBRYD ) 40 MG TABS, Take 1 tablet (40 mg total) by mouth daily. (Patient taking differently: Take 40 mg by mouth every morning.), Disp: 90 tablet, Rfl: 1   zolpidem  (AMBIEN ) 10 MG tablet, Take 1 tablet (10 mg total) by mouth at bedtime. (Patient taking differently: Take 10 mg by mouth at bedtime as needed.), Disp: 90 tablet, Rfl: 1  "

## 2024-06-08 ENCOUNTER — Ambulatory Visit: Payer: Self-pay | Admitting: Psychology

## 2024-11-30 ENCOUNTER — Ambulatory Visit: Admitting: Behavioral Health
